# Patient Record
Sex: Male | Born: 1935 | Race: White | Hispanic: No | Marital: Married | Smoking: Never smoker
Health system: Southern US, Community
[De-identification: ages and names within clinical notes are randomized; demographics above are authoritative.]

## PROBLEM LIST (undated history)

## (undated) DIAGNOSIS — K219 Gastro-esophageal reflux disease without esophagitis: Secondary | ICD-10-CM

## (undated) DIAGNOSIS — I10 Essential (primary) hypertension: Secondary | ICD-10-CM

## (undated) DIAGNOSIS — E079 Disorder of thyroid, unspecified: Secondary | ICD-10-CM

## (undated) DIAGNOSIS — R600 Localized edema: Secondary | ICD-10-CM

## (undated) DIAGNOSIS — R7303 Prediabetes: Secondary | ICD-10-CM

## (undated) HISTORY — PX: CARPAL TUNNEL RELEASE: SHX101

## (undated) HISTORY — PX: BACK SURGERY: SHX140

## (undated) HISTORY — PX: CATARACT EXTRACTION: SUR2

## (undated) HISTORY — PX: OTHER SURGICAL HISTORY: SHX169

---

## 2009-11-08 ENCOUNTER — Ambulatory Visit: Payer: Self-pay | Admitting: Unknown Physician Specialty

## 2009-11-09 LAB — PATHOLOGY REPORT

## 2011-01-03 ENCOUNTER — Emergency Department: Payer: Self-pay | Admitting: Emergency Medicine

## 2011-01-29 ENCOUNTER — Ambulatory Visit: Payer: Self-pay | Admitting: Ophthalmology

## 2011-12-31 ENCOUNTER — Ambulatory Visit: Payer: Self-pay

## 2012-01-02 ENCOUNTER — Ambulatory Visit: Payer: Self-pay | Admitting: Orthopedic Surgery

## 2012-01-02 DIAGNOSIS — I10 Essential (primary) hypertension: Secondary | ICD-10-CM

## 2012-01-03 ENCOUNTER — Ambulatory Visit: Payer: Self-pay | Admitting: Orthopedic Surgery

## 2012-01-08 LAB — PATHOLOGY REPORT

## 2013-07-28 ENCOUNTER — Ambulatory Visit: Payer: Self-pay | Admitting: Ophthalmology

## 2013-08-09 ENCOUNTER — Ambulatory Visit: Payer: Self-pay | Admitting: Ophthalmology

## 2014-07-12 NOTE — Op Note (Signed)
PATIENT NAME:  Lucas Melendez, Lucas Melendez MR#:  [id number] DATE OF BIRTH:  [birth year]  DATE OF PROCEDURE:  01/03/2012  PREOPERATIVE DIAGNOSIS: L2 compression fracture.   POSTOPERATIVE DIAGNOSIS: L2 compression fracture.   PROCEDURE: L2 vertebral body biopsy and kyphoplasty.   ANESTHESIA: MAC.   SURGEON: Leitha SchullerMichael J. Keishana Klinger, MD  DESCRIPTION OF PROCEDURE: Patient brought to the Operating Room. After adequate anesthesia was given the back was prepped and draped in the usual sterile fashion after making sure that good imaging was available on both AP and lateral C-arm views. After timeout procedure were carried out 10 mL of 1% Xylocaine with epinephrine was infiltrated into subcutaneous tissue. The spinal needle was then used to localize and give local anesthetic down to the pedicle. Stab incisions were then carried out. First on the right side the trocar was coming down lateral and then through the pedicle into the vertebral body, care being taken on both AP and lateral projections that there is no penetration of the canal or neuroforamen. Next, the biopsy was carried out with good biopsy of the vertebral body obtained with good bone specimen. Melendez drill was then used to get deeper and the trocar was then entered on the left side in Melendez similar fashion but without Melendez biopsy. The two Kyphon inflatable balloons were inserted at the appropriate level and inflated. This gave some reduction to Melendez very flat vertebral body. After that had been completed the left side was left inflated and the right side was filled with 3 mL of polymethylmethacrylate bone cement. The left side was then deflated, shaft of the balloon pulled out and 3 mL placed on the left side. There appeared to be good fill of the vertebral body along the endplates without any extravasation or pushing back towards the canal. There did appear to be some reduction of the large posterior bulge to the vertebral body. The trocars were removed. Permanent C-arm views were  saved. The wounds were covered with Dermabond and then band aids. Patient was sent to recovery room in stable condition.   ESTIMATED BLOOD LOSS: 25 mL.   COMPLICATIONS: None.   SPECIMENS: None.   ____________________________ Leitha SchullerMichael J. Bryant Lipps, MD mjm:cms D: 01/03/2012 16:34:10 ET T: 01/03/2012 16:56:53 ET JOB#: [id number]  cc: Leitha SchullerMichael J. Libbie Bartley, MD, <Dictator>

## 2014-07-16 NOTE — Op Note (Signed)
PATIENT NAME:  Lucas Melendez, Mizael A MR#:  [id number] DATE OF BIRTH:  1931/03/01  DATE OF PROCEDURE:  08/09/2013  PREOPERATIVE DIAGNOSIS: Cataract, right eye.   POSTOPERATIVE DIAGNOSIS: Cataract, right eye.   PROCEDURE PERFORMED: Extracapsular cataract extraction using phacoemulsification with placement of Alcon SN6CWS 18.5-diopter posterior chamber lens, serial number [id number].   SURGEON: Maylon PeppersSteven A. Cadell Gabrielson, MD   ANESTHESIA: 4% lidocaine and 0.75% Marcaine in a 50-50 mixture with 10 units/mL of Hylenex added, given as a peribulbar.   ANESTHESIOLOGIST: Amy M. Rice, MD  COMPLICATIONS: None.   ESTIMATED BLOOD LOSS: Less than 1 mL.   DESCRIPTION OF PROCEDURE:  The patient was brought to the operating room and given a peribulbar block.  The patient was then prepped and draped in the usual fashion.  The vertical rectus muscles were imbricated using 5-0 silk sutures.  These sutures were then clamped to the sterile drapes as bridle sutures.  A limbal peritomy was performed extending two clock hours and hemostasis was obtained with cautery.  A partial thickness scleral groove was made at the surgical limbus and dissected anteriorly in a lamellar dissection using an Alcon crescent knife.  The anterior chamber was entered superonasally with a Superblade and through the lamellar dissection with a 2.6 mm keratome.  DisCoVisc was used to replace the aqueous and a continuous tear capsulorrhexis was carried out.  Hydrodissection and hydrodelineation were carried out with balanced salt and a 27 gauge canula.  The nucleus was rotated to confirm the effectiveness of the hydrodissection.  Phacoemulsification was carried out using a divide-and-conquer technique.  Total ultrasound time was 1 minute and 29 seconds with an average power of 25.5%, CDE of 38.37.  Irrigation/aspiration was used to remove the residual cortex.  DisCoVisc was used to inflate the capsule and the internal incision was enlarged to 3 mm with  the crescent knife.  The intraocular lens was folded and inserted into the capsular bag using the Alcon AcrySert delivery system.  Irrigation/aspiration was used to remove the residual DisCoVisc.  Miostat was injected into the anterior chamber through the paracentesis track to inflate the anterior chamber and induce miosis.  0.1 mL of cefuroxime was injected via the paracentesis track containing 1 mg of drug.  The wound was checked for leaks and none were found. The conjunctiva was closed with cautery and the bridle sutures were removed.  Two drops of 0.3% Vigamox were placed on the eye.   An eye shield was placed on the eye.  The patient was discharged to the recovery room in good condition.   ____________________________ Maylon PeppersSteven A. Berle Fitz, MD sad:jcm D: 08/09/2013 13:39:12 ET T: 08/09/2013 14:39:03 ET JOB#: [id number]  cc: Viviann SpareSteven A. Ilo Beamon, MD, <Dictator> Erline LevineSTEVEN A Alastair Hennes MD ELECTRONICALLY SIGNED 08/30/2013 13:50

## 2015-01-23 ENCOUNTER — Other Ambulatory Visit: Payer: Self-pay | Admitting: Infectious Diseases

## 2015-01-23 DIAGNOSIS — R05 Cough: Secondary | ICD-10-CM

## 2015-01-23 DIAGNOSIS — R053 Chronic cough: Secondary | ICD-10-CM

## 2015-01-23 DIAGNOSIS — R9389 Abnormal findings on diagnostic imaging of other specified body structures: Secondary | ICD-10-CM

## 2015-02-02 ENCOUNTER — Ambulatory Visit
Admission: RE | Admit: 2015-02-02 | Discharge: 2015-02-02 | Disposition: A | Discharge status: Home / Self Care | Payer: Medicare Other | Source: Ambulatory Visit | Attending: Infectious Diseases | Admitting: Infectious Diseases

## 2015-02-02 DIAGNOSIS — R05 Cough: Secondary | ICD-10-CM

## 2015-02-02 DIAGNOSIS — J849 Interstitial pulmonary disease, unspecified: Secondary | ICD-10-CM | POA: Insufficient documentation

## 2015-02-02 DIAGNOSIS — R938 Abnormal findings on diagnostic imaging of other specified body structures: Secondary | ICD-10-CM | POA: Diagnosis present

## 2015-02-02 DIAGNOSIS — R053 Chronic cough: Secondary | ICD-10-CM

## 2015-02-02 DIAGNOSIS — R9389 Abnormal findings on diagnostic imaging of other specified body structures: Secondary | ICD-10-CM

## 2016-03-08 ENCOUNTER — Encounter: Payer: Self-pay | Admitting: Intensive Care

## 2016-03-08 ENCOUNTER — Emergency Department: Payer: Medicare Other

## 2016-03-08 ENCOUNTER — Inpatient Hospital Stay
Admission: EM | Admit: 2016-03-08 | Discharge: 2016-03-12 | DRG: 481 | Disposition: A | Discharge status: Home Health | Payer: Medicare Other | Attending: Internal Medicine | Admitting: Internal Medicine

## 2016-03-08 DIAGNOSIS — K219 Gastro-esophageal reflux disease without esophagitis: Secondary | ICD-10-CM | POA: Diagnosis present

## 2016-03-08 DIAGNOSIS — I10 Essential (primary) hypertension: Secondary | ICD-10-CM | POA: Diagnosis present

## 2016-03-08 DIAGNOSIS — Y92007 Garden or yard of unspecified non-institutional (private) residence as the place of occurrence of the external cause: Secondary | ICD-10-CM | POA: Diagnosis not present

## 2016-03-08 DIAGNOSIS — Z833 Family history of diabetes mellitus: Secondary | ICD-10-CM

## 2016-03-08 DIAGNOSIS — E871 Hypo-osmolality and hyponatremia: Secondary | ICD-10-CM | POA: Diagnosis present

## 2016-03-08 DIAGNOSIS — Z79899 Other long term (current) drug therapy: Secondary | ICD-10-CM

## 2016-03-08 DIAGNOSIS — W19XXXA Unspecified fall, initial encounter: Secondary | ICD-10-CM

## 2016-03-08 DIAGNOSIS — S72141A Displaced intertrochanteric fracture of right femur, initial encounter for closed fracture: Principal | ICD-10-CM | POA: Diagnosis present

## 2016-03-08 DIAGNOSIS — Z808 Family history of malignant neoplasm of other organs or systems: Secondary | ICD-10-CM | POA: Diagnosis not present

## 2016-03-08 DIAGNOSIS — S72001A Fracture of unspecified part of neck of right femur, initial encounter for closed fracture: Secondary | ICD-10-CM

## 2016-03-08 DIAGNOSIS — W1830XA Fall on same level, unspecified, initial encounter: Secondary | ICD-10-CM | POA: Diagnosis present

## 2016-03-08 DIAGNOSIS — M6281 Muscle weakness (generalized): Secondary | ICD-10-CM

## 2016-03-08 DIAGNOSIS — E039 Hypothyroidism, unspecified: Secondary | ICD-10-CM | POA: Diagnosis present

## 2016-03-08 DIAGNOSIS — Z7982 Long term (current) use of aspirin: Secondary | ICD-10-CM | POA: Diagnosis not present

## 2016-03-08 DIAGNOSIS — M25551 Pain in right hip: Secondary | ICD-10-CM

## 2016-03-08 DIAGNOSIS — Z8249 Family history of ischemic heart disease and other diseases of the circulatory system: Secondary | ICD-10-CM

## 2016-03-08 DIAGNOSIS — R262 Difficulty in walking, not elsewhere classified: Secondary | ICD-10-CM

## 2016-03-08 DIAGNOSIS — E785 Hyperlipidemia, unspecified: Secondary | ICD-10-CM | POA: Diagnosis present

## 2016-03-08 DIAGNOSIS — Z419 Encounter for procedure for purposes other than remedying health state, unspecified: Secondary | ICD-10-CM

## 2016-03-08 HISTORY — DX: Essential (primary) hypertension: I10

## 2016-03-08 HISTORY — DX: Disorder of thyroid, unspecified: E07.9

## 2016-03-08 HISTORY — DX: Gastro-esophageal reflux disease without esophagitis: K21.9

## 2016-03-08 LAB — CBC WITH DIFFERENTIAL/PLATELET
BASOS PCT: 1 %
Basophils Absolute: 0 10*3/uL (ref 0–0.1)
EOS ABS: 0.1 10*3/uL (ref 0–0.7)
EOS PCT: 1 %
HCT: 41.6 % (ref 40.0–52.0)
Hemoglobin: 14.4 g/dL (ref 13.0–18.0)
LYMPHS ABS: 1.3 10*3/uL (ref 1.0–3.6)
Lymphocytes Relative: 13 %
MCH: 33.2 pg (ref 26.0–34.0)
MCHC: 34.6 g/dL (ref 32.0–36.0)
MCV: 95.8 fL (ref 80.0–100.0)
MONOS PCT: 7 %
Monocytes Absolute: 0.7 10*3/uL (ref 0.2–1.0)
Neutro Abs: 7.9 10*3/uL — ABNORMAL HIGH (ref 1.4–6.5)
Neutrophils Relative %: 78 %
PLATELETS: 290 10*3/uL (ref 150–440)
RBC: 4.34 MIL/uL — ABNORMAL LOW (ref 4.40–5.90)
RDW: 12.4 % (ref 11.5–14.5)
WBC: 10 10*3/uL (ref 3.8–10.6)

## 2016-03-08 LAB — TSH: TSH: 1.225 u[IU]/mL (ref 0.350–4.500)

## 2016-03-08 LAB — COMPREHENSIVE METABOLIC PANEL
ALK PHOS: 70 U/L (ref 38–126)
ALT: 15 U/L — ABNORMAL LOW (ref 17–63)
ANION GAP: 8 (ref 5–15)
AST: 20 U/L (ref 15–41)
Albumin: 4.5 g/dL (ref 3.5–5.0)
BILIRUBIN TOTAL: 0.7 mg/dL (ref 0.3–1.2)
BUN: 16 mg/dL (ref 6–20)
CALCIUM: 8.9 mg/dL (ref 8.9–10.3)
CO2: 26 mmol/L (ref 22–32)
Chloride: 94 mmol/L — ABNORMAL LOW (ref 101–111)
Creatinine, Ser: 0.87 mg/dL (ref 0.61–1.24)
GLUCOSE: 141 mg/dL — AB (ref 65–99)
Potassium: 4.4 mmol/L (ref 3.5–5.1)
Sodium: 128 mmol/L — ABNORMAL LOW (ref 135–145)
TOTAL PROTEIN: 7.7 g/dL (ref 6.5–8.1)

## 2016-03-08 LAB — TYPE AND SCREEN
ABO/RH(D): A POS
Antibody Screen: NEGATIVE

## 2016-03-08 MED ORDER — FENTANYL CITRATE (PF) 100 MCG/2ML IJ SOLN
100.0000 ug | Freq: Once | INTRAMUSCULAR | Status: AC
  Administered 2016-03-08: 100 ug via INTRAVENOUS

## 2016-03-08 MED ORDER — LISINOPRIL 20 MG PO TABS
40.0000 mg | ORAL_TABLET | Freq: Every day | ORAL | Status: DC
  Administered 2016-03-10 – 2016-03-12 (×3): 40 mg via ORAL
  Filled 2016-03-08 (×3): qty 2

## 2016-03-08 MED ORDER — ACETAMINOPHEN 325 MG PO TABS
650.0000 mg | ORAL_TABLET | Freq: Four times a day (QID) | ORAL | Status: DC | PRN
  Administered 2016-03-11: 650 mg via ORAL
  Filled 2016-03-08: qty 2

## 2016-03-08 MED ORDER — OXYCODONE HCL 5 MG PO TABS
ORAL_TABLET | ORAL | Status: AC
  Filled 2016-03-08: qty 1

## 2016-03-08 MED ORDER — OXYCODONE HCL 5 MG PO TABS
ORAL_TABLET | ORAL | Status: AC
  Administered 2016-03-08: 5 mg via ORAL
  Filled 2016-03-08: qty 1

## 2016-03-08 MED ORDER — MORPHINE SULFATE (PF) 4 MG/ML IV SOLN
2.0000 mg | INTRAVENOUS | Status: DC | PRN
  Administered 2016-03-08 – 2016-03-09 (×3): 2 mg via INTRAVENOUS
  Filled 2016-03-08 (×3): qty 1

## 2016-03-08 MED ORDER — SODIUM CHLORIDE 0.9 % IV SOLN
INTRAVENOUS | Status: DC
  Administered 2016-03-08 – 2016-03-10 (×3): via INTRAVENOUS

## 2016-03-08 MED ORDER — ACETAMINOPHEN 650 MG RE SUPP: 650.0000 mg | Freq: Four times a day (QID) | RECTAL | Status: DC | PRN

## 2016-03-08 MED ORDER — SIMVASTATIN 20 MG PO TABS
10.0000 mg | ORAL_TABLET | Freq: Every day | ORAL | Status: DC
  Administered 2016-03-10 – 2016-03-11 (×2): 10 mg via ORAL
  Filled 2016-03-08 (×2): qty 1

## 2016-03-08 MED ORDER — OXYCODONE HCL 5 MG PO TABS
5.0000 mg | ORAL_TABLET | ORAL | Status: DC | PRN
  Administered 2016-03-08 – 2016-03-12 (×5): 5 mg via ORAL
  Filled 2016-03-08 (×4): qty 1

## 2016-03-08 MED ORDER — LEVOTHYROXINE SODIUM 75 MCG PO TABS
150.0000 ug | ORAL_TABLET | Freq: Every day | ORAL | Status: DC
  Administered 2016-03-10 – 2016-03-12 (×3): 150 ug via ORAL
  Filled 2016-03-08 (×3): qty 2

## 2016-03-08 MED ORDER — FENTANYL CITRATE (PF) 100 MCG/2ML IJ SOLN
INTRAMUSCULAR | Status: AC
  Administered 2016-03-08: 100 ug via INTRAVENOUS
  Filled 2016-03-08: qty 2

## 2016-03-08 MED ORDER — ONDANSETRON HCL 4 MG/2ML IJ SOLN
4.0000 mg | Freq: Once | INTRAMUSCULAR | Status: AC
  Administered 2016-03-08: 4 mg via INTRAVENOUS

## 2016-03-08 MED ORDER — ONDANSETRON HCL 4 MG/2ML IJ SOLN
INTRAMUSCULAR | Status: AC
  Administered 2016-03-08: 4 mg via INTRAVENOUS
  Filled 2016-03-08: qty 2

## 2016-03-08 NOTE — ED Provider Notes (Signed)
[facility] Emergency Department Provider Note  Time seen: 6:52 PM  I have reviewed the triage vital signs and the nursing notes.   HISTORY  Chief Complaint Hip Pain (Right) and Fall    HPI Lucas Melendez is a 82 y.o. male with a past medical history of hypertension, gastric reflux, presents to the emergency department with right hip pain after a fall. According to the patient he was attempting to reach down into a garbage can when he lost his footing causing him to fall onto his right side. States immediate pain to the right hip, unable to bear weight due to pain. Did not hit head, did not lose consciousness. Patient describes severe right hip pain with any attempted movement, mild right hip pain when resting. Denies any other pains at this time.  Past Medical History:  Diagnosis Date  . Acid reflux   . Hypertension   . Thyroid disease    hyper    There are no active problems to display for this patient.   Past Surgical History:  Procedure Laterality Date  . BACK SURGERY    . CARPAL TUNNEL RELEASE      Prior to Admission medications   Not on File    No Known Allergies  History reviewed. No pertinent family history.  Social History Social History  Substance Use Topics  . Smoking status: Never Smoker  . Smokeless tobacco: Never Used  . Alcohol use 12.6 oz/week    21 Cans of beer per week     Comment: 2-3 beers everynight    Review of Systems Constitutional: Negative for fever. Cardiovascular: Negative for chest pain. Respiratory: Negative for shortness of breath. Gastrointestinal: Negative for abdominal pain Musculoskeletal: Right hip pain Neurological: Negative for headache. Denies any weakness or numbness. 10-point ROS otherwise negative.  ____________________________________________   PHYSICAL EXAM:  VITAL SIGNS: ED Triage Vitals  Enc Vitals Group     BP 03/08/16 1825 (!) 187/103     Pulse Rate 03/08/16 1825 75     Resp  03/08/16 1825 20     Temp 03/08/16 1825 97.9 F (36.6 C)     Temp Source 03/08/16 1825 Oral     SpO2 03/08/16 1825 98 %     Weight 03/08/16 1826 220 lb (99.8 kg)     Height 03/08/16 1826 5\' 10"  (1.778 m)     Head Circumference --      Peak Flow --      Pain Score 03/08/16 1826 7     Pain Loc --      Pain Edu? --      Excl. in GC? --     Constitutional: Alert and oriented. Well appearing and in no distress. Eyes: Normal exam ENT   Head: Normocephalic and atraumatic.   Mouth/Throat: Mucous membranes are moist. Cardiovascular: Normal rate, regular rhythm. No murmur Respiratory: Normal respiratory effort without tachypnea nor retractions. Breath sounds are clear Gastrointestinal: Soft and nontender. No distention. Musculoskeletal: Mild right hip tenderness to palpation, significant pain with attempted range of motion. Neurovascularly intact distally, extremity is otherwise atraumatic. Stable. Neurologic:  Normal speech and language. No gross focal neurologic deficits Skin:  Skin is warm, dry and intact.  Psychiatric: Mood and affect are normal.   ____________________________________________    EKG  EKG shows normal sinus rhythm at 78 bpm, narrow QRS, left axis deviation, nonspecific but no concerning ST changes, largely normal intervals.  ____________________________________________    RADIOLOGY  Right intertrochanteric fracture. Chest  x-ray shows interstitial fibrosis and cardiomegaly.  ____________________________________________   INITIAL IMPRESSION / ASSESSMENT AND PLAN / ED COURSE  Pertinent labs & imaging results that were available during my care of the patient were reviewed by me and considered in my medical decision making (see chart for details).  Patient presents with right hip pain after fall. We'll obtain x-ray imaging, highly suspect fracture. Overall the patient appears well, no distress while resting comfortably in bed.  X-ray consistent with  right intertrochanteric fracture. Labs show a slight hyponatremia. Patient will be admitted to the hospitalist service with orthopedics consultation.  ____________________________________________   FINAL CLINICAL IMPRESSION(S) / ED DIAGNOSES  Fall Right intertrochanteric fracture   Minna AntisKevin Griselda Bramblett, MD 03/08/16 [phone]

## 2016-03-08 NOTE — H&P (Signed)
[facility] [place] [facility]   PATIENT NAME: Lucas BatemanDelano Mapes    MR#:  [id number]  DATE OF BIRTH:  07-Feb-1931  DATE OF ADMISSION:  03/08/2016  PRIMARY CARE PHYSICIAN: Gayla DossFitzgerald, David M, MD   REQUESTING/REFERRING PHYSICIAN: Dr Minna AntisKevin Paduchowski  CHIEF COMPLAINT:   Chief Complaint  Patient presents with  . Hip Pain    Right  . Fall    HISTORY OF PRESENT ILLNESS:  Lucas BatemanDelano Lucas Melendez  is a 82 y.o. male presents with right hip pain. He threw stuff in the garbage can that he thought his wife may have wanted and he was reaching into the garbage and he got tangled up and fell on his hip and couldn't get up. The neighbors helped him to get into the house and he couldn't straighten out his leg. Having a good amount of pain in his right hip and came into the hospital for further evaluation. He was found to have a right hip fracture. No complaints of chest pain or shortness of breath. Good exercise capacity when he walks around.  PAST MEDICAL HISTORY:   Past Medical History:  Diagnosis Date  . Acid reflux   . Hypertension   . Thyroid disease    hyper    PAST SURGICAL HISTORY:   Past Surgical History:  Procedure Laterality Date  . BACK SURGERY    . CARPAL TUNNEL RELEASE    . CATARACT EXTRACTION    . khyphoplasty      SOCIAL HISTORY:   Social History  Substance Use Topics  . Smoking status: Never Smoker  . Smokeless tobacco: Never Used  . Alcohol use 12.6 oz/week    21 Cans of beer per week     Comment: 3 beers everynight    FAMILY HISTORY:   Family History  Problem Relation Age of Onset  . CAD Mother   . Diabetes Mother   . Throat cancer Father     DRUG ALLERGIES:  No Known Allergies  REVIEW OF SYSTEMS:  CONSTITUTIONAL: No fever, fatigue or weakness.  EYES: 1 I week her vision. EARS, NOSE, AND THROAT: No tinnitus or ear pain. No sore throat RESPIRATORY: No cough, shortness of breath, wheezing or hemoptysis.  CARDIOVASCULAR: No chest  pain, orthopnea, edema.  GASTROINTESTINAL: No nausea, vomiting, diarrhea or abdominal pain. No blood in bowel movements GENITOURINARY: No dysuria, hematuria.  ENDOCRINE: No polyuria, nocturia. Positive for hypothyroidism  HEMATOLOGY: No anemia, easy bruising or bleeding SKIN: No rash or lesion. MUSCULOSKELETAL: Right hip pain. Arthritis bilateral hips  NEUROLOGIC: No tingling, numbness, weakness.  PSYCHIATRY: No anxiety or depression.   MEDICATIONS AT HOME:   Prior to Admission medications   Medication Sig Start Date End Date Taking? Authorizing Provider  aspirin EC 81 MG tablet Take 81 mg by mouth daily.   Yes Historical Provider, MD  levothyroxine (SYNTHROID, LEVOTHROID) 150 MCG tablet Take 150 mcg by mouth daily before breakfast.   Yes Historical Provider, MD  lisinopril (PRINIVIL,ZESTRIL) 40 MG tablet Take 40 mg by mouth daily.   Yes Historical Provider, MD  simvastatin (ZOCOR) 10 MG tablet Take 1 tablet by mouth daily. 01/01/16   Historical Provider, MD      VITAL SIGNS:  Blood pressure (!) 145/72, pulse 87, temperature 97.9 F (36.6 C), temperature source Oral, resp. rate 20, height 5\' 10"  (1.778 m), weight 99.8 kg (220 lb), SpO2 100 %.  PHYSICAL EXAMINATION:  GENERAL:  82 y.o.-year-old patient lying in the bed with no acute distress.  EYES:  Pupils equal, round, reactive to light and accommodation. No scleral icterus. Extraocular muscles intact.  HEENT: Head atraumatic, normocephalic. Oropharynx and nasopharynx clear.  NECK:  Supple, no jugular venous distention. No thyroid enlargement, no tenderness.  LUNGS: Normal breath sounds bilaterally, no wheezing, rales,rhonchi or crepitation. No use of accessory muscles of respiration.  CARDIOVASCULAR: S1, S2 normal. No murmurs, rubs, or gallops.  ABDOMEN: Soft, nontender, nondistended. Bowel sounds present. No organomegaly or mass.  EXTREMITIES: Trace edema. No cyanosis, or clubbing.  Right leg shortened and externally  rotated NEUROLOGIC: Cranial nerves II through XII are intact. Sensation intact bilateral lower extremities. Gait not checked. Able to flex and extend bilateral ankles PSYCHIATRIC: The patient is alert and oriented x 3.  SKIN: No rash, lesion, or ulcer.   LABORATORY PANEL:   CBC  Recent Labs Lab 03/08/16 1837  WBC 10.0  HGB 14.4  HCT 41.6  PLT 290   ------------------------------------------------------------------------------------------------------------------  Chemistries   Recent Labs Lab 03/08/16 1837  NA 128*  K 4.4  CL 94*  CO2 26  GLUCOSE 141*  BUN 16  CREATININE 0.87  CALCIUM 8.9  AST 20  ALT 15*  ALKPHOS 70  BILITOT 0.7   ------------------------------------------------------------------------------------------------------------------    RADIOLOGY:  Dg Chest 1 View  Result Date: 03/08/2016 CLINICAL DATA:  Right hip fracture following a fall today. EXAM: CHEST 1 VIEW COMPARISON:  Chest CT dated 02/02/2015. FINDINGS: Enlarged cardiac silhouette. Diffusely prominent interstitial markings. Thoracic spine degenerative changes. IMPRESSION: Cardiomegaly and interstitial fibrosis. Electronically Signed   By: Beckie SaltsSteven  Reid M.D.   On: 03/08/2016 19:29   Dg Hip Unilat W Or Wo Pelvis 2-3 Views Right  Result Date: 03/08/2016 CLINICAL DATA:  Right hip pain after falling today. EXAM: DG HIP (WITH OR WITHOUT PELVIS) 2-3V RIGHT COMPARISON:  None. FINDINGS: Comminuted right intertrochanteric fracture with distraction of the fragments superiorly. Diffuse osteopenia. IMPRESSION: Comminuted right intertrochanteric fracture. Electronically Signed   By: Beckie SaltsSteven  Reid M.D.   On: 03/08/2016 19:28    EKG:   Normal sinus rhythm 78 bpm, APC, left axis deviation  IMPRESSION AND PLAN:   1. Preoperative consultation for right hip fracture, closed, initial encounter requiring operative repair. No contraindications to surgery at this time. ER physician spoke with on-call orthopedic  surgeon to evaluate the patient for surgery tomorrow. 2. Hyponatremia. Looking back at old labs the patient has had some low sodiums in the past. This could be secondary to drinking 3 beers per day versus interstitial lung disease. The patient also has hypothyroidism and elevated on a TSH to prior labs. Gentle fluid hydration overnight and recheck a sodium in the morning. 3. Essential hypertension on lisinopril 4. Hyperlipidemia unspecified on Zocor 5. Hypothyroidism unspecified on levothyroxine. Check a TSH 6. History of interstitial lung disease on chest x-ray and prior CT scan. Patient never has any shortness of breath. Patient has a pulse ox of 100% on room air.   All the records are reviewed and case discussed with ED provider. Management plans discussed with the patient, family and they are in agreement.  CODE STATUS: Full code  TOTAL TIME TAKING CARE OF THIS PATIENT: 50 minutes.    Alford HighlandWIETING, Hollynn Garno M.D on 03/08/2016 at 8:25 PM  Between 7am to 6pm - Pager - [phone]  After 6pm call admission pager [phone]  [facility]  [phone]  CC: Primary care physician; Gayla DossFitzgerald, David M, MD

## 2016-03-08 NOTE — ED Triage Notes (Signed)
Patient arrived by EMS from home for fall. PAtient c/o R hip pain. Patient states he has been having groin pain off and on for months that occurs more with yard work. Patients feet slid out from under him when taking out the trash. HX HTN and Hyperthyroid. Denies hitting head or LOC. Takes 81mg  aspirin daily. CLear lungs. 200/80 b/p and 98% RA with EMS. A&O x4 upon arrival

## 2016-03-09 ENCOUNTER — Inpatient Hospital Stay: Payer: Medicare Other | Admitting: Anesthesiology

## 2016-03-09 ENCOUNTER — Inpatient Hospital Stay: Payer: Medicare Other

## 2016-03-09 ENCOUNTER — Encounter
Admission: EM | Disposition: A | Discharge status: Home Health | Payer: Self-pay | Source: Home / Self Care | Attending: Internal Medicine

## 2016-03-09 ENCOUNTER — Encounter: Payer: Self-pay | Admitting: Anesthesiology

## 2016-03-09 HISTORY — PX: COMPRESSION HIP SCREW: SHX1386

## 2016-03-09 LAB — CBC
HCT: 36.1 % — ABNORMAL LOW (ref 40.0–52.0)
Hemoglobin: 12.8 g/dL — ABNORMAL LOW (ref 13.0–18.0)
MCH: 33.4 pg (ref 26.0–34.0)
MCHC: 35.4 g/dL (ref 32.0–36.0)
MCV: 94.4 fL (ref 80.0–100.0)
PLATELETS: 245 10*3/uL (ref 150–440)
RBC: 3.83 MIL/uL — ABNORMAL LOW (ref 4.40–5.90)
RDW: 12.9 % (ref 11.5–14.5)
WBC: 9.5 10*3/uL (ref 3.8–10.6)

## 2016-03-09 LAB — SODIUM, URINE, RANDOM: Sodium, Ur: 150 mmol/L

## 2016-03-09 LAB — BASIC METABOLIC PANEL
Anion gap: 6 (ref 5–15)
BUN: 13 mg/dL (ref 6–20)
CALCIUM: 8.4 mg/dL — AB (ref 8.9–10.3)
CO2: 24 mmol/L (ref 22–32)
CREATININE: 0.7 mg/dL (ref 0.61–1.24)
Chloride: 100 mmol/L — ABNORMAL LOW (ref 101–111)
GFR calc non Af Amer: >60 mL/min (ref >60)
GLUCOSE: 137 mg/dL — AB (ref 65–99)
Potassium: 4.2 mmol/L (ref 3.5–5.1)
Sodium: 130 mmol/L — ABNORMAL LOW (ref 135–145)

## 2016-03-09 LAB — MRSA PCR SCREENING: MRSA by PCR: NEGATIVE

## 2016-03-09 LAB — OSMOLALITY, URINE: OSMOLALITY UR: 743 mosm/kg (ref 300–900)

## 2016-03-09 SURGERY — COMPRESSION HIP
Anesthesia: General | Site: Hip | Laterality: Right | Wound class: Clean

## 2016-03-09 MED ORDER — NEOMYCIN-POLYMYXIN B GU 40-200000 IR SOLN
Status: DC | PRN
  Administered 2016-03-09: 4 mL

## 2016-03-09 MED ORDER — FENTANYL CITRATE (PF) 100 MCG/2ML IJ SOLN
INTRAMUSCULAR | Status: DC | PRN
  Administered 2016-03-09 (×2): 50 ug via INTRAVENOUS

## 2016-03-09 MED ORDER — FENTANYL CITRATE (PF) 100 MCG/2ML IJ SOLN
25.0000 ug | INTRAMUSCULAR | Status: DC | PRN
  Administered 2016-03-09 (×2): 25 ug via INTRAVENOUS

## 2016-03-09 MED ORDER — PROPOFOL 10 MG/ML IV BOLUS
INTRAVENOUS | Status: DC | PRN
  Administered 2016-03-09: 120 mg via INTRAVENOUS

## 2016-03-09 MED ORDER — ENOXAPARIN SODIUM 40 MG/0.4ML ~~LOC~~ SOLN
40.0000 mg | SUBCUTANEOUS | Status: DC
  Administered 2016-03-10 – 2016-03-12 (×3): 40 mg via SUBCUTANEOUS
  Filled 2016-03-09 (×3): qty 0.4

## 2016-03-09 MED ORDER — ONDANSETRON HCL 4 MG PO TABS
4.0000 mg | ORAL_TABLET | Freq: Four times a day (QID) | ORAL | Status: DC | PRN
  Administered 2016-03-11: 4 mg via ORAL
  Filled 2016-03-09: qty 1

## 2016-03-09 MED ORDER — FENTANYL CITRATE (PF) 100 MCG/2ML IJ SOLN
INTRAMUSCULAR | Status: AC
  Filled 2016-03-09: qty 2

## 2016-03-09 MED ORDER — METOCLOPRAMIDE HCL 10 MG PO TABS: 5.0000 mg | ORAL_TABLET | Freq: Three times a day (TID) | ORAL | Status: DC | PRN

## 2016-03-09 MED ORDER — DEXAMETHASONE SODIUM PHOSPHATE 10 MG/ML IJ SOLN
INTRAMUSCULAR | Status: DC | PRN
  Administered 2016-03-09: 4 mg via INTRAVENOUS

## 2016-03-09 MED ORDER — ONDANSETRON HCL 4 MG/2ML IJ SOLN
INTRAMUSCULAR | Status: DC | PRN
  Administered 2016-03-09: 4 mg via INTRAVENOUS

## 2016-03-09 MED ORDER — SUCCINYLCHOLINE CHLORIDE 20 MG/ML IJ SOLN
INTRAMUSCULAR | Status: DC | PRN
  Administered 2016-03-09: 120 mg via INTRAVENOUS

## 2016-03-09 MED ORDER — PHENYLEPHRINE HCL 10 MG/ML IJ SOLN
INTRAMUSCULAR | Status: DC | PRN
  Administered 2016-03-09: 100 ug via INTRAVENOUS

## 2016-03-09 MED ORDER — OXYCODONE HCL 5 MG/5ML PO SOLN: 5.0000 mg | Freq: Once | ORAL | Status: DC | PRN

## 2016-03-09 MED ORDER — LIDOCAINE HCL (CARDIAC) 20 MG/ML IV SOLN
INTRAVENOUS | Status: DC | PRN
  Administered 2016-03-09: 60 mg via INTRAVENOUS

## 2016-03-09 MED ORDER — METOCLOPRAMIDE HCL 5 MG/ML IJ SOLN: 5.0000 mg | Freq: Three times a day (TID) | INTRAMUSCULAR | Status: DC | PRN

## 2016-03-09 MED ORDER — ROCURONIUM BROMIDE 100 MG/10ML IV SOLN
INTRAVENOUS | Status: DC | PRN
  Administered 2016-03-09: 5 mg via INTRAVENOUS
  Administered 2016-03-09: 10 mg via INTRAVENOUS

## 2016-03-09 MED ORDER — SUGAMMADEX SODIUM 500 MG/5ML IV SOLN
INTRAVENOUS | Status: DC | PRN
Start: 2016-03-09 — End: 2016-03-09
  Administered 2016-03-09: 200 mg via INTRAVENOUS

## 2016-03-09 MED ORDER — KETOROLAC TROMETHAMINE 15 MG/ML IJ SOLN
15.0000 mg | Freq: Three times a day (TID) | INTRAMUSCULAR | Status: AC
  Administered 2016-03-09 – 2016-03-10 (×3): 15 mg via INTRAVENOUS
  Filled 2016-03-09 (×3): qty 1

## 2016-03-09 MED ORDER — CEFAZOLIN SODIUM-DEXTROSE 2-4 GM/100ML-% IV SOLN
2.0000 g | Freq: Three times a day (TID) | INTRAVENOUS | Status: AC
  Administered 2016-03-09 – 2016-03-10 (×3): 2 g via INTRAVENOUS
  Filled 2016-03-09 (×3): qty 100

## 2016-03-09 MED ORDER — ONDANSETRON HCL 4 MG/2ML IJ SOLN: 4.0000 mg | Freq: Four times a day (QID) | INTRAMUSCULAR | Status: DC | PRN

## 2016-03-09 MED ORDER — ACETAMINOPHEN 10 MG/ML IV SOLN
INTRAVENOUS | Status: DC | PRN
  Administered 2016-03-09: 1000 mg via INTRAVENOUS

## 2016-03-09 MED ORDER — OXYCODONE HCL 5 MG PO TABS: 5.0000 mg | ORAL_TABLET | Freq: Once | ORAL | Status: DC | PRN

## 2016-03-09 SURGICAL SUPPLY — 32 items
BLADE HELICAL 105 (Orthopedic Implant) ×2 IMPLANT
BLADE HELICAL 105MM (Orthopedic Implant) ×1 IMPLANT
CANISTER SUCT 1200ML W/VALVE (MISCELLANEOUS) ×3 IMPLANT
DRAPE C-ARMOR (DRAPES) ×3 IMPLANT
DRAPE SHEET LG 3/4 BI-LAMINATE (DRAPES) ×3 IMPLANT
DRAPE TABLE BACK 80X90 (DRAPES) ×3 IMPLANT
DRSG DERMACEA 8X12 NADH (GAUZE/BANDAGES/DRESSINGS) ×3 IMPLANT
DURAPREP 26ML APPLICATOR (WOUND CARE) ×3 IMPLANT
ELECT REM PT RETURN 9FT ADLT (ELECTROSURGICAL) ×3
ELECTRODE REM PT RTRN 9FT ADLT (ELECTROSURGICAL) ×1 IMPLANT
GAUZE SPONGE 4X4 12PLY STRL (GAUZE/BANDAGES/DRESSINGS) ×3 IMPLANT
GLOVE BIOGEL M STRL SZ7.5 (GLOVE) ×3 IMPLANT
GLOVE INDICATOR 8.0 STRL GRN (GLOVE) ×3 IMPLANT
GOWN STRL REUS W/ TWL LRG LVL3 (GOWN DISPOSABLE) ×2 IMPLANT
GOWN STRL REUS W/TWL LRG LVL3 (GOWN DISPOSABLE) ×4
GUIDEWIRE 3.2X400 (WIRE) ×3 IMPLANT
MAT BLUE FLOOR 46X72 FLO (MISCELLANEOUS) ×3 IMPLANT
NAIL TFN 11/125 170 (Nail) ×3 IMPLANT
NS IRRIG 1000ML POUR BTL (IV SOLUTION) ×3 IMPLANT
PACK HIP COMPR (MISCELLANEOUS) ×3 IMPLANT
REAMER ROD DEEP FLUTE 2.5X950 (INSTRUMENTS) ×3 IMPLANT
SCREW LOCKING IM 5.0MX40M (Screw) ×3 IMPLANT
SOL PREP PVP 2OZ (MISCELLANEOUS) ×3
SOLUTION PREP PVP 2OZ (MISCELLANEOUS) ×1 IMPLANT
STAPLER SKIN PROX 35W (STAPLE) ×3 IMPLANT
STRAP SAFETY BODY (MISCELLANEOUS) ×3 IMPLANT
SUCTION FRAZIER HANDLE 10FR (MISCELLANEOUS) ×2
SUCTION TUBE FRAZIER 10FR DISP (MISCELLANEOUS) ×1 IMPLANT
SUT VIC AB 0 CT1 36 (SUTURE) ×3 IMPLANT
SUT VIC AB 1 CT1 36 (SUTURE) ×3 IMPLANT
SUT VIC AB 2-0 CT1 27 (SUTURE) ×2
SUT VIC AB 2-0 CT1 TAPERPNT 27 (SUTURE) ×1 IMPLANT

## 2016-03-09 NOTE — Anesthesia Preprocedure Evaluation (Signed)
Anesthesia Evaluation  Patient identified by MRN, date of birth, ID band Patient awake    Reviewed: Allergy & Precautions, H&P , NPO status , Patient's Chart, lab work & pertinent test results  History of Anesthesia Complications Negative for: history of anesthetic complications  Airway Mallampati: III  TM Distance: <3 FB Neck ROM: limited    Dental no notable dental hx. (+) Poor Dentition, Chipped, Missing, Partial Lower   Pulmonary neg pulmonary ROS, neg shortness of breath,    Pulmonary exam normal breath sounds clear to auscultation       Cardiovascular Exercise Tolerance: Good hypertension, (-) angina(-) Past MI and (-) DOE Normal cardiovascular exam Rhythm:regular Rate:Normal     Neuro/Psych negative neurological ROS  negative psych ROS   GI/Hepatic Neg liver ROS, GERD  Controlled,  Endo/Other  negative endocrine ROS  Renal/GU      Musculoskeletal   Abdominal   Peds  Hematology negative hematology ROS (+)   Anesthesia Other Findings Past Medical History: No date: Acid reflux No date: Hypertension No date: Thyroid disease     Comment: hyper  Past Surgical History: No date: BACK SURGERY No date: CARPAL TUNNEL RELEASE No date: CATARACT EXTRACTION No date: khyphoplasty  BMI    Body Mass Index:  31.57 kg/m      Reproductive/Obstetrics negative OB ROS                             Anesthesia Physical Anesthesia Plan  ASA: III  Anesthesia Plan: General ETT   Post-op Pain Management:    Induction:   Airway Management Planned:   Additional Equipment:   Intra-op Plan:   Post-operative Plan:   Informed Consent: I have reviewed the patients History and Physical, chart, labs and discussed the procedure including the risks, benefits and alternatives for the proposed anesthesia with the patient or authorized representative who has indicated his/her understanding and  acceptance.   Dental Advisory Given  Plan Discussed with: Anesthesiologist, CRNA and Surgeon  Anesthesia Plan Comments: (Due to patient previous back surgery plan is for GETA over spinal  Patient and family informed that patient is higher risk for complications from anesthesia during this procedure due to their medical history and age including but not limited to post operative cognitive dysfunction.  They voiced understanding. )        Anesthesia Quick Evaluation

## 2016-03-09 NOTE — Progress Notes (Signed)
[employer] - [place] at [facility]   PATIENT NAME: Lucas Melendez    MR#:  [id number]  DATE OF BIRTH:  1930-12-16  SUBJECTIVE:  CHIEF COMPLAINT:   Chief Complaint  Patient presents with  . Hip Pain    Right  . Fall   No pain in his right hip at this time. Return from surgery. No shortness of breath or fever or vomiting.  REVIEW OF SYSTEMS:    Review of Systems  Constitutional: Negative for chills and fever.  HENT: Negative for sore throat.   Eyes: Negative for blurred vision, double vision and pain.  Respiratory: Negative for cough, hemoptysis, shortness of breath and wheezing.   Cardiovascular: Negative for chest pain, palpitations, orthopnea and leg swelling.  Gastrointestinal: Negative for abdominal pain, constipation, diarrhea, heartburn, nausea and vomiting.  Genitourinary: Negative for dysuria and hematuria.  Musculoskeletal: Positive for joint pain. Negative for back pain.  Skin: Negative for rash.  Neurological: Negative for sensory change, speech change, focal weakness and headaches.  Endo/Heme/Allergies: Does not bruise/bleed easily.  Psychiatric/Behavioral: Negative for depression. The patient is not nervous/anxious.     DRUG ALLERGIES:  No Known Allergies  VITALS:  Blood pressure (!) 126/58, pulse 76, temperature 98.5 F (36.9 C), temperature source Oral, resp. rate 18, height 5\' 10"  (1.778 m), weight 99.8 kg (220 lb), SpO2 94 %.  PHYSICAL EXAMINATION:   Physical Exam  GENERAL:  82 y.o.-year-old patient lying in the bed with no acute distress.  EYES: Pupils equal, round, reactive to light and accommodation. No scleral icterus. Extraocular muscles intact.  HEENT: Head atraumatic, normocephalic. Oropharynx and nasopharynx clear.  NECK:  Supple, no jugular venous distention. No thyroid enlargement, no tenderness.  LUNGS: Normal breath sounds bilaterally, no wheezing, rales, rhonchi. No use of accessory muscles of respiration.  CARDIOVASCULAR:  S1, S2 normal. No murmurs, rubs, or gallops.  ABDOMEN: Soft, nontender, nondistended. Bowel sounds present. No organomegaly or mass.  EXTREMITIES: No cyanosis, clubbing or edema b/l.   Right hip dressing NEUROLOGIC: Cranial nerves II through XII are intact. No focal Motor or sensory deficits b/l.   PSYCHIATRIC: The patient is alert and oriented x 3.  SKIN: No obvious rash, lesion, or ulcer.   LABORATORY PANEL:   CBC  Recent Labs Lab 03/09/16 0350  WBC 9.5  HGB 12.8*  HCT 36.1*  PLT 245   ------------------------------------------------------------------------------------------------------------------ Chemistries   Recent Labs Lab 03/08/16 1837 03/09/16 0350  NA 128* 130*  K 4.4 4.2  CL 94* 100*  CO2 26 24  GLUCOSE 141* 137*  BUN 16 13  CREATININE 0.87 0.70  CALCIUM 8.9 8.4*  AST 20  --   ALT 15*  --   ALKPHOS 70  --   BILITOT 0.7  --    ------------------------------------------------------------------------------------------------------------------  Cardiac Enzymes No results for input(s): TROPONINI in the last 168 hours. ------------------------------------------------------------------------------------------------------------------  RADIOLOGY:  Dg Chest 1 View  Result Date: 03/08/2016 CLINICAL DATA:  Right hip fracture following a fall today. EXAM: CHEST 1 VIEW COMPARISON:  Chest CT dated 02/02/2015. FINDINGS: Enlarged cardiac silhouette. Diffusely prominent interstitial markings. Thoracic spine degenerative changes. IMPRESSION: Cardiomegaly and interstitial fibrosis. Electronically Signed   By: Beckie SaltsSteven  Reid M.D.   On: 03/08/2016 19:29   Dg Hip Operative Unilat W Or W/o Pelvis Right  Result Date: 03/09/2016 CLINICAL DATA:  Internal fixation EXAM: OPERATIVE RIGHT HIP (WITH PELVIS IF PERFORMED) 4 VIEWS TECHNIQUE: Fluoroscopic spot image(s) were submitted for interpretation post-operatively. COMPARISON:  03/08/2016 FINDINGS: Intraoperative spot imaging  demonstrates changes of internal fixation across the right femoral intertrochanteric fracture. Near anatomic alignment. No hardware complicating feature. IMPRESSION: Internal fixation without visible complicating feature. Electronically Signed   By: Charlett NoseKevin  Dover M.D.   On: 03/09/2016 10:14   Dg Hip Unilat W Or Wo Pelvis 2-3 Views Right  Result Date: 03/08/2016 CLINICAL DATA:  Right hip pain after falling today. EXAM: DG HIP (WITH OR WITHOUT PELVIS) 2-3V RIGHT COMPARISON:  None. FINDINGS: Comminuted right intertrochanteric fracture with distraction of the fragments superiorly. Diffuse osteopenia. IMPRESSION: Comminuted right intertrochanteric fracture. Electronically Signed   By: Beckie SaltsSteven  Reid M.D.   On: 03/08/2016 19:28     ASSESSMENT AND PLAN:   * Right hip fracture S/p Open fixation of right hip fracture. POD # 1 Pain meds. DVT prophylaxis per orthopedics  * HTN Continue meds  * Chronic mild hyponatremia Monitor  All the records are reviewed and case discussed with Care Management/Social Workerr. Management plans discussed with the patient, family and they are in agreement.  CODE STATUS: FULL  DVT Prophylaxis: SCDs  TOTAL TIME TAKING CARE OF THIS PATIENT: 30 minutes.   POSSIBLE D/C IN 2-3 DAYS, DEPENDING ON CLINICAL CONDITION.  Milagros LollSudini, Garmon Dehn R M.D on 03/09/2016 at 1:09 PM  Between 7am to 6pm - Pager - [phone]  After 6pm go to [email or web address] - password [email or web address] [facility]  [facility] [place] [facility]  Office  [phone]  CC: Primary care physician; Gayla DossFitzgerald, David M, MD  Note: This dictation was prepared with Dragon dictation along with smaller phrase technology. Any transcriptional errors that result from this process are unintentional.

## 2016-03-09 NOTE — Interval H&P Note (Signed)
History and Physical Interval Note:  03/09/2016 8:40 AM  Lucas Melendez  has presented today for surgery, with the diagnosis of right introchanteric fracture  The various methods of treatment have been discussed with the patient and family. After consideration of risks, benefits and other options for treatment, the patient has consented to  Procedure(s): COMPRESSION HIP (Right) as a surgical intervention .  The patient's history has been reviewed, patient examined, no change in status, stable for surgery.  I have reviewed the patient's chart and labs.  Questions were answered to the patient's satisfaction.     Weber CooksKeivan Bella KennedyAbtahi

## 2016-03-09 NOTE — Transfer of Care (Addendum)
Immediate Anesthesia Transfer of Care Note  Patient: Lucas Melendez  Procedure(s) Performed: Procedure(s): COMPRESSION HIP (Right)  Patient Location: PACU  Anesthesia Type:General  Level of Consciousness: sedated  Airway & Oxygen Therapy: Patient Spontanous Breathing and Patient connected to face mask oxygen  Post-op Assessment: Report given to RN and Post -op Vital signs reviewed and stable  Post vital signs: Reviewed and stable  Last Vitals:  Vitals:   03/09/16 0754 03/09/16 1013  BP: (!) 121/56 159/70  Pulse: 78 75  Resp: 18 14  Temp: 36.8 C (P) 36.2 C    Last Pain:  Vitals:   03/09/16 0754  TempSrc: Oral  PainSc:          Complications: No apparent anesthesia complications

## 2016-03-09 NOTE — Anesthesia Procedure Notes (Signed)
Procedure Name: Intubation Date/Time: 03/09/2016 9:03 AM Performed by: Lily KocherPERALTA, Apple Dearmas Pre-anesthesia Checklist: Patient identified, Patient being monitored, Timeout performed, Emergency Drugs available and Suction available Patient Re-evaluated:Patient Re-evaluated prior to inductionOxygen Delivery Method: Circle system utilized Preoxygenation: Pre-oxygenation with 100% oxygen Intubation Type: IV induction Ventilation: Mask ventilation without difficulty Laryngoscope Size: Mac and 4 Grade View: Grade I Tube type: Oral Tube size: 7.5 mm Number of attempts: 1 Airway Equipment and Method: Stylet Placement Confirmation: ETT inserted through vocal cords under direct vision,  positive ETCO2 and breath sounds checked- equal and bilateral Secured at: 23 cm Tube secured with: Tape Dental Injury: Teeth and Oropharynx as per pre-operative assessment

## 2016-03-09 NOTE — Anesthesia Postprocedure Evaluation (Signed)
Anesthesia Post Note  Patient: Lucas Melendez  Procedure(s) Performed: Procedure(s) (LRB): COMPRESSION HIP (Right)  Patient location during evaluation: PACU Anesthesia Type: General Level of consciousness: awake and alert Pain management: pain level controlled Vital Signs Assessment: post-procedure vital signs reviewed and stable Respiratory status: spontaneous breathing, nonlabored ventilation, respiratory function stable and patient connected to nasal cannula oxygen Cardiovascular status: blood pressure returned to baseline and stable Postop Assessment: no signs of nausea or vomiting Anesthetic complications: no    Last Vitals:  Vitals:   03/09/16 1053 03/09/16 1121  BP:  123/72  Pulse:  78  Resp:  18  Temp: 36.9 C 36.6 C    Last Pain:  Vitals:   03/09/16 1121  TempSrc: Oral  PainSc:                  [name]

## 2016-03-09 NOTE — Plan of Care (Signed)
Problem: Pain Managment: Goal: General experience of comfort will improve Outcome: Progressing Pain control with iv pain medications. Pt npo for surgery in morning.

## 2016-03-09 NOTE — Consult Note (Signed)
Orthopaedic Surgery consultation  Impression: R intertrochanteric hip fracture  Plan: Nonweight bearing to affected side.  Hospitalist to risk stratify for surgical fixation.  Hold DVT prophylaxis.  Discussed risks benefits and alternatives to fixation. Questions were sought and answered. Patient wishes to proceed.   HPI: This is a 82 year old male with history of ground level fall today while getting trash outside. Patient immediately felt pain and was brought to the ED. Ambulates with no assistive device. Patient is having isolated hip pain at this time.   Past Medical History:  Diagnosis Date  . Acid reflux   . Hypertension   . Thyroid disease    hyper    No current facility-administered medications on file prior to encounter.    No current outpatient prescriptions on file prior to encounter.    No Known Allergies  Past Surgical History:  Procedure Laterality Date  . BACK SURGERY    . CARPAL TUNNEL RELEASE    . CATARACT EXTRACTION    . khyphoplasty      Social History   Social History  . Marital status: Married    Spouse name: N/A  . Number of children: N/A  . Years of education: N/A   Occupational History  . Not on file.   Social History Main Topics  . Smoking status: Never Smoker  . Smokeless tobacco: Never Used  . Alcohol use 12.6 oz/week    21 Cans of beer per week     Comment: 3 beers everynight  . Drug use: No  . Sexual activity: Not on file   Other Topics Concern  . Not on file   Social History Narrative  . No narrative on file    Family History  Problem Relation Age of Onset  . CAD Mother   . Diabetes Mother   . Throat cancer Father     Vitals:   03/09/16 0457 03/09/16 0754  BP: 121/66 (!) 121/56  Pulse: 84 78  Resp: 18 18  Temp: 98.2 F (36.8 C) 98.2 F (36.8 C)    Radiology:  AP/Lateral R hip demonstrates 2 part intertrochanteric hip fracture  Physical Exam: General: alert and interactive. No acute distress at this time.   HEENT: Trachea is midline Cardiovascular: regular rate and rhythm per palpation Respiratory: No increased work of breathing; symmetric chest rise.  MSK: No openings or abrasions noted.  +TTP at hip. +log roll. No pain at knee or ankle. +motor DF/PF/EHL +sensation L4-S1. +2/4 pulses  Contralateral LE also evaluated. No pain at hip knee or ankle. ROM well preserved.   Upper extremities: no pain at shoulder girdles elbows wrists or hands. +motor M, U, R nn intact. +sensation C5-T1. +2/4 pulses.   Impression and plan as above.

## 2016-03-09 NOTE — Evaluation (Signed)
Physical Therapy Evaluation Patient Details Name: Lucas Melendez MRN: [id number] DOB: 06-11-1930 Today's Date: 03/09/2016   History of Present Illness  Lucas Melendez is an 82yo white male who [facility] after sustaining a fall at home while climbing into a garbage can, found to have sustained a Rt hip fracture: pt presents now POD0 s/p Rt ORIF. 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   Clinical Impression  Pt admitted with above diagnosis. Pt currently with functional limitations due to the deficits listed below (see PT Problem List). Upon entry, the patient is received semirecumbent in bed, no family/caregiver present. The pt is awake and agreeable to participate. No acute distress noted at this time, pain is well managed. VSS during eval on room air, assessed by NA halfway through. The pt is alert and oriented x3, pleasant, conversational, and following simple and multi-step commands consistently. HEP training on RLE demonstrates hip range limitations and strength deficits due to pain, min-modA required to perform.  Functional mobility assessment demonstrates moderate weakness, the pt now requiring Min-mod-assist physical assistance for bed mobility and transfers, unable to perform extensive gait at this time, whereas the patient performs these independently at baseline. Pt does appear steady and stable once in a standing position with RW, denies dizziness, nausea, or major pain exacerbation. Pt will benefit from skilled PT to increase their independence and safety with mobility to allow discharge to the venue listed below.       Follow Up Recommendations Home health PT    Equipment Recommendations  Rolling walker with 5" wheels    Recommendations for Other Services       Precautions / Restrictions Precautions Precautions: Fall Restrictions Weight Bearing Restrictions: Yes RLE Weight Bearing: Weight bearing as tolerated       Mobility  Bed Mobility Overal bed mobility: Needs Assistance Bed Mobility: Supine to Sit     Supine to sit: Mod assist     General bed mobility comments: assist mostly with RLE, but also limited in trunk flexion strength.   Transfers Overall transfer level: Needs assistance Equipment used: Rolling walker (2 wheeled) Transfers: Sit to/from UGI CorporationStand;Stand Pivot Transfers Sit to Stand: From elevated surface;Min assist Stand pivot transfers: Min guard       General transfer comment: verbal cues for safe RW use.   Ambulation/Gait Ambulation/Gait assistance:  (bed to chair only, )              Stairs            Wheelchair Mobility    Modified Rankin (Stroke Patients Only)       Balance Overall balance assessment: No apparent balance deficits (not formally assessed);Needs assistance Sitting-balance support: Feet supported;No upper extremity supported Sitting balance-Leahy Scale: Good     Standing balance support: Bilateral upper extremity supported;During functional activity Standing balance-Leahy Scale: Fair                               Pertinent Vitals/Pain Pain Assessment: 0-10 Pain Score: 3  Pain Location: Rt lateral hip  Pain Descriptors / Indicators: Aching Pain Intervention(s): Limited activity within patient's tolerance;Monitored during session;Premedicated before session;Repositioned    Home Living Family/patient expects to be discharged to:: Private residence Living Arrangements: Spouse/significant other Available Help at Discharge: Family Type of Home: House Home Access: Stairs to enter Entrance Stairs-Rails: Can reach both Entrance Stairs-Number of Steps: 2 Home  Layout: One level Home Equipment: Walker - 2 wheels;Bedside commode;Cane - single point      Prior Function Level of Independence: Independent               Hand Dominance        Extremity/Trunk Assessment   Upper Extremity Assessment Upper Extremity  Assessment: Overall WFL for tasks assessed    Lower Extremity Assessment Lower Extremity Assessment: Overall WFL for tasks assessed (postoperative limitations.)       Communication   Communication: No difficulties  Cognition Arousal/Alertness: Awake/alert Behavior During Therapy: WFL for tasks assessed/performed Overall Cognitive Status: Within Functional Limits for tasks assessed                      General Comments      Exercises General Exercises - Lower Extremity Ankle Circles/Pumps: AROM;Both;15 reps Short Arc Quad: Right;AROM;10 reps Heel Slides: AAROM;Right;10 reps Hip ABduction/ADduction: AAROM;Right;10 reps   Assessment/Plan    PT Assessment Patient needs continued PT services  PT Problem List Decreased strength;Decreased range of motion;Decreased activity tolerance;Decreased balance;Decreased mobility;Pain          PT Treatment Interventions DME instruction;Gait training;Stair training;Functional mobility training;Therapeutic activities;Therapeutic exercise;Balance training;Patient/family education    PT Goals (Current goals can be found in the Care Plan section)  Acute Rehab PT Goals Patient Stated Goal: return to home with HHPT to improve gait PT Goal Formulation: With patient Time For Goal Achievement: 03/16/16 Potential to Achieve Goals: Good    Frequency BID   Barriers to discharge        Co-evaluation               End of Session   Activity Tolerance: Patient tolerated treatment well;Patient limited by pain Patient left: in chair;with call bell/phone within reach;with chair alarm set;with family/visitor present Nurse Communication: Mobility status         Time: [id number] PT Time Calculation (min) (ACUTE ONLY): 25 min   Charges:   PT Evaluation $PT Eval Low Complexity: 1 Procedure PT Treatments $Therapeutic Exercise: 8-22 mins   PT G Codes:        3:13 PM, 03/09/16 Rosamaria LintsAllan C Buccola, PT, DPT Physical Therapist -  [place] [phone](ASCOM)  [phone] (mobile)

## 2016-03-09 NOTE — Op Note (Signed)
Operative Note  Surgeon: Bernita RaisinKeivan Bralynn Velador DO  Assistants: none  Anesthesia: General endotracheal anesthesia   Preoperative Dx: Right IT fracture  Postoperative Dx: Same  Procedure Open fixation of right hip fracture Physician guided fluoroscopy <1 hour  Implants: short Synthes TFN 11x125; 105mm helical blade 5mm diameter interocking screw x1  Indications for procedure:  Lucas Melendez is an 82 year old male who sustained a ground level fall and subsequent R intertrochanteric femur fracture. After risks benefits and alternatives to procedure were discussed with family/patient, questions were sought and answered consent was signed and wished to proceed.   Patient was taken to the operative suite and placed in the supine position on a fracture table, all bony prominences were padded. She was placed under general anesthesia per anesthesiologist. The correct lower extremity was prepped and draped in the usual fashion. Patient was given preoperative antibiotics prior to incision. Traction, IR and adduction was used to perform reduction. Fluoroscopy confirmed reduction.  Time out was performed and all in the room agreed. A 3cm incision was made proximal to the greater trochanter. 3.2 mm guidewire was introduced onto the greater trochanter. An appropriate starting point was achieved using fluoroscopy and orthogonal views and was found to be center center. Once this was done, the opening reamer was placed. The ball tipped guide wire was then placed up to the level of the physeal scar. Images confirmed intramedullary placement of guide wire. Measurement was obtained. A size 11x170x125 degree mm nail was chosen. The Nail was assembled placed through the opening reamer pathway.This was malleted in gently and the nail was seated. The position was confirmed using fluoroscopy. At this time, the jig was assembled. A skin incision was made on the lateral femur through IT band. The trochar was advanced down to bone. A  guide wire was placed down the trochar while visualizing on the lateral image. This was found to be center center on orthogonal views. A measurement was taken. A drill was placed to 105 mm. A 105 helical blade was placed on the insertion handle and malleted into position. Flexible screw driver was used to tighten the proximally. The trochar for the distal interlock was placed, skin incision was made. This was drilled measured and filled with a 5.0 mm interlocking screw according to AO technique. The remaining equipment was removed including the guidewire. The handle was then removed using the acorn handle. Final images were obtained. The wounds were irrigated with copious amounts of normal saline.The subcutaneous tissue was closed using 2-0 vicryl and the skin incision was closed using staples. Sterile dressings were placed using aquacel ag bandages.   Patient recovered from general anesthesia per anesthesiologist. Tolerated procedure well without complications.   Complications: none   Blood loss :100cc  Urine output and fluids per anesthesiologist.

## 2016-03-10 LAB — BASIC METABOLIC PANEL
Anion gap: 5 (ref 5–15)
BUN: 15 mg/dL (ref 6–20)
CALCIUM: 8 mg/dL — AB (ref 8.9–10.3)
CHLORIDE: 97 mmol/L — AB (ref 101–111)
CO2: 26 mmol/L (ref 22–32)
CREATININE: 0.73 mg/dL (ref 0.61–1.24)
GFR calc non Af Amer: >60 mL/min (ref >60)
Glucose, Bld: 131 mg/dL — ABNORMAL HIGH (ref 65–99)
Potassium: 4.3 mmol/L (ref 3.5–5.1)
SODIUM: 128 mmol/L — AB (ref 135–145)

## 2016-03-10 LAB — HEMOGLOBIN: HEMOGLOBIN: 10.9 g/dL — AB (ref 13.0–18.0)

## 2016-03-10 MED ORDER — DOCUSATE SODIUM 100 MG PO CAPS
200.0000 mg | ORAL_CAPSULE | Freq: Two times a day (BID) | ORAL | Status: DC
  Administered 2016-03-10 – 2016-03-12 (×4): 200 mg via ORAL
  Filled 2016-03-10 (×5): qty 2

## 2016-03-10 MED ORDER — POLYETHYLENE GLYCOL 3350 17 G PO PACK: 17.0000 g | PACK | Freq: Every day | ORAL | Status: DC | PRN

## 2016-03-10 NOTE — Progress Notes (Signed)
[facility] - [place] at [facility]   PATIENT NAME: Lucas BatemanDelano Melendez    MR#:  [id number]  DATE OF BIRTH:  06-09-1930  SUBJECTIVE:  CHIEF COMPLAINT:   Chief Complaint  Patient presents with  . Hip Pain    Right  . Fall   No pain in his right hip at this time. Return from surgery. No shortness of breath or fever or vomiting.  REVIEW OF SYSTEMS:    Review of Systems  Constitutional: Negative for chills and fever.  HENT: Negative for sore throat.   Eyes: Negative for blurred vision, double vision and pain.  Respiratory: Negative for cough, hemoptysis, shortness of breath and wheezing.   Cardiovascular: Negative for chest pain, palpitations, orthopnea and leg swelling.  Gastrointestinal: Negative for abdominal pain, constipation, diarrhea, heartburn, nausea and vomiting.  Genitourinary: Negative for dysuria and hematuria.  Musculoskeletal: Positive for joint pain. Negative for back pain.  Skin: Negative for rash.  Neurological: Negative for sensory change, speech change, focal weakness and headaches.  Endo/Heme/Allergies: Does not bruise/bleed easily.  Psychiatric/Behavioral: Negative for depression. The patient is not nervous/anxious.     DRUG ALLERGIES:  No Known Allergies  VITALS:  Blood pressure 132/61, pulse 71, temperature 97.7 F (36.5 C), temperature source Oral, resp. rate 20, height 5\' 10"  (1.778 m), weight 99.8 kg (220 lb), SpO2 96 %.  PHYSICAL EXAMINATION:   Physical Exam  GENERAL:  82 y.o.-year-old patient lying in the bed with no acute distress.  EYES: Pupils equal, round, reactive to light and accommodation. No scleral icterus. Extraocular muscles intact.  HEENT: Head atraumatic, normocephalic. Oropharynx and nasopharynx clear.  NECK:  Supple, no jugular venous distention. No thyroid enlargement, no tenderness.  LUNGS: Normal breath sounds bilaterally, no wheezing, rales, rhonchi. No use of accessory muscles of respiration.  CARDIOVASCULAR: S1,  S2 normal. No murmurs, rubs, or gallops.  ABDOMEN: Soft, nontender, nondistended. Bowel sounds present. No organomegaly or mass.  EXTREMITIES: No cyanosis, clubbing or edema b/l.   Right hip dressing NEUROLOGIC: Cranial nerves II through XII are intact. No focal Motor or sensory deficits b/l.   PSYCHIATRIC: The patient is alert and oriented x 3.  SKIN: No obvious rash, lesion, or ulcer.   LABORATORY PANEL:   CBC  Recent Labs Lab 03/09/16 0350 03/10/16 0412  WBC 9.5  --   HGB 12.8* 10.9*  HCT 36.1*  --   PLT 245  --    ------------------------------------------------------------------------------------------------------------------ Chemistries   Recent Labs Lab 03/08/16 1837  03/10/16 0412  NA 128*  < > 128*  K 4.4  < > 4.3  CL 94*  < > 97*  CO2 26  < > 26  GLUCOSE 141*  < > 131*  BUN 16  < > 15  CREATININE 0.87  < > 0.73  CALCIUM 8.9  < > 8.0*  AST 20  --   --   ALT 15*  --   --   ALKPHOS 70  --   --   BILITOT 0.7  --   --   < > = values in this interval not displayed. ------------------------------------------------------------------------------------------------------------------  Cardiac Enzymes No results for input(s): TROPONINI in the last 168 hours. ------------------------------------------------------------------------------------------------------------------  RADIOLOGY:  Dg Chest 1 View  Result Date: 03/08/2016 CLINICAL DATA:  Right hip fracture following a fall today. EXAM: CHEST 1 VIEW COMPARISON:  Chest CT dated 02/02/2015. FINDINGS: Enlarged cardiac silhouette. Diffusely prominent interstitial markings. Thoracic spine degenerative changes. IMPRESSION: Cardiomegaly and interstitial fibrosis. Electronically Signed   By:  Beckie SaltsSteven  Reid M.D.   On: 03/08/2016 19:29   Dg Hip Operative Unilat W Or W/o Pelvis Right  Result Date: 03/09/2016 CLINICAL DATA:  Internal fixation EXAM: OPERATIVE RIGHT HIP (WITH PELVIS IF PERFORMED) 4 VIEWS TECHNIQUE: Fluoroscopic  spot image(s) were submitted for interpretation post-operatively. COMPARISON:  03/08/2016 FINDINGS: Intraoperative spot imaging demonstrates changes of internal fixation across the right femoral intertrochanteric fracture. Near anatomic alignment. No hardware complicating feature. IMPRESSION: Internal fixation without visible complicating feature. Electronically Signed   By: Charlett NoseKevin  Dover M.D.   On: 03/09/2016 10:14   Dg Hip Unilat W Or Wo Pelvis 2-3 Views Right  Result Date: 03/08/2016 CLINICAL DATA:  Right hip pain after falling today. EXAM: DG HIP (WITH OR WITHOUT PELVIS) 2-3V RIGHT COMPARISON:  None. FINDINGS: Comminuted right intertrochanteric fracture with distraction of the fragments superiorly. Diffuse osteopenia. IMPRESSION: Comminuted right intertrochanteric fracture. Electronically Signed   By: Beckie SaltsSteven  Reid M.D.   On: 03/08/2016 19:28     ASSESSMENT AND PLAN:   * Right hip fracture S/p Open fixation of right hip fracture. POD # 1 Pain meds. DVT prophylaxis per orthopedics  * HTN Continue meds  * Chronic mild hyponatremia Monitor  All the records are reviewed and case discussed with Care Management/Social Workerr. Management plans discussed with the patient, family and they are in agreement.  CODE STATUS: FULL  DVT Prophylaxis: SCDs  TOTAL TIME TAKING CARE OF THIS PATIENT: 30 minutes.   Improving slowly. Likely discharge tomorrow home with home health.  Milagros LollSudini, Lucas Melendez R M.D on 03/10/2016 at 1:01 PM  Between 7am to 6pm - Pager - [phone]  After 6pm go to [email or web address] - password [email or web address] [facility]  [facility] [place] [facility]  Office  [phone]  CC: Primary care physician; Gayla DossFitzgerald, David M, MD  Note: This dictation was prepared with Dragon dictation along with smaller phrase technology. Any transcriptional errors that result from this process are unintentional.

## 2016-03-10 NOTE — Consult Note (Signed)
Orthopaedic Progress Note  Subjective: Overnight patient doing well. Pain controlled. Was able to get up with physical therapy and ambulate a few feet. No chest pain or shortness of breath.    Objective:   Vitals:   03/10/16 0450 03/10/16 0748  BP: (!) 121/53 132/61  Pulse: 71 71  Resp: 20   Temp: 98.1 F (36.7 C) 97.7 F (36.5 C)   Hgb 10.9 Na 128  Physical Exam:  General: alert and interactive. No acute distress MSK:  RLE: dressing clean and dry. +motor DF/PF/EHL. +sensation L4-S1. +2/4 pulses Calves soft and Nontender LLE: No pain at hip knee or ankle. ROM preserved. +motor/sensation.   Assessment: POD 1 s/p R hip CMN  Plan:  Weight bearing as tolerated to R lower extremity.  Physical Therapy to assist with gait trainging, strength and balance. DVT prophylaxis-Lovenox x 30 days.  Hyponatremia-Hospitalist addressing.  Case management to see for possible rehab versus home with home health. Follow up in 10-14 days with Ortho for staple removal.

## 2016-03-11 ENCOUNTER — Encounter: Payer: Self-pay | Admitting: Orthopaedic Surgery

## 2016-03-11 LAB — BASIC METABOLIC PANEL
ANION GAP: 7 (ref 5–15)
BUN: 12 mg/dL (ref 6–20)
CHLORIDE: 98 mmol/L — AB (ref 101–111)
CO2: 26 mmol/L (ref 22–32)
Calcium: 8.4 mg/dL — ABNORMAL LOW (ref 8.9–10.3)
Creatinine, Ser: 0.71 mg/dL (ref 0.61–1.24)
GFR calc non Af Amer: >60 mL/min (ref >60)
GLUCOSE: 147 mg/dL — AB (ref 65–99)
POTASSIUM: 4.3 mmol/L (ref 3.5–5.1)
Sodium: 131 mmol/L — ABNORMAL LOW (ref 135–145)

## 2016-03-11 LAB — HEMOGLOBIN: Hemoglobin: 10.7 g/dL — ABNORMAL LOW (ref 13.0–18.0)

## 2016-03-11 MED ORDER — OXYCODONE HCL 5 MG PO TABS: 5.0000 mg | ORAL_TABLET | ORAL | 0 refills | Status: DC | PRN

## 2016-03-11 MED ORDER — ENOXAPARIN SODIUM 40 MG/0.4ML ~~LOC~~ SOLN: 40.0000 mg | SUBCUTANEOUS | 0 refills | Status: DC

## 2016-03-11 MED ORDER — BISACODYL 5 MG PO TBEC
10.0000 mg | DELAYED_RELEASE_TABLET | Freq: Once | ORAL | Status: AC
  Administered 2016-03-11: 10 mg via ORAL
  Filled 2016-03-11: qty 2

## 2016-03-11 NOTE — Progress Notes (Signed)
Physical Therapy Treatment Patient Details Name: Lucas Melendez MRN: [id number] DOB: 06-08-1930 Today's Date: 03/11/2016    History of Present Illness Lucas Melendez is an 82yo white male who comes to [facility] on 12/15 after sustaining a fall at home while climbing into a garbage can, found to have sustained a Rt hip fracture: pt presents now POD0 s/p Rt ORIF. 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     PT Comments    Intermittent buckling of R LE in loading, tends to maintain in partial flexion throughout gait cycle.  Good response to cuing/therex designed to emphasize closed-chain R TKE; will continue to reinforce throughout remaining hospitalization.   Follow Up Recommendations  Home health PT     Equipment Recommendations       Recommendations for Other Services       Precautions / Restrictions Precautions Precautions: Fall Restrictions Weight Bearing Restrictions: Yes RLE Weight Bearing: Weight bearing as tolerated    Mobility  Bed Mobility               General bed mobility comments: Seated in recliner beginning/end of session  Transfers Overall transfer level: Needs assistance Equipment used: Rolling walker (2 wheeled) Transfers: Sit to/from Stand Sit to Stand: Min guard         General transfer comment: heavy use of bilat UEs with lift off and overall movement transition  Ambulation/Gait Ambulation/Gait assistance: Min guard;Min assist Ambulation Distance (Feet): 75 Feet Assistive device: Rolling walker (2 wheeled)       General Gait Details: step to gait pattern with heavy WBing bilat UEs; min cuing for R TKE in loading phases of gait cycle   Stairs            Wheelchair Mobility    Modified Rankin (Stroke Patients Only)       Balance Overall balance assessment: Needs assistance Sitting-balance support: No upper extremity supported;Feet supported Sitting balance-Leahy Scale: Good      Standing balance support: Bilateral upper extremity supported Standing balance-Leahy Scale: Fair                      Cognition Arousal/Alertness: Awake/alert Behavior During Therapy: WFL for tasks assessed/performed Overall Cognitive Status: Within Functional Limits for tasks assessed                      Exercises Other Exercises Other Exercises: Sit/stand x5 with RW, cga/min assist-cuing for hand placement, weight shift and overall technique with movement transition.  Limited use of R LE ; unable to complete without bilat UE support. Other Exercises: Standing LE therex, 1x10, with RW, cga: R closed-chain TKE; L LE forward/retro, lateral stepping, emphasis on R knee control in modified SLS during L LE advancemen    General Comments        Pertinent Vitals/Pain Pain Assessment: 0-10 Pain Score: 5  Pain Location: R hip/knee Pain Descriptors / Indicators: Aching Pain Intervention(s): Limited activity within patient's tolerance;Monitored during session;Premedicated before session    Home Living                      Prior Function            PT Goals (current goals can now be found in the care plan section) Acute Rehab PT Goals Patient Stated Goal: return to home with HHPT to improve gait PT Goal Formulation: With patient Time For Goal Achievement: 03/16/16  Potential to Achieve Goals: Good Progress towards PT goals: Progressing toward goals    Frequency    BID      PT Plan Current plan remains appropriate    Co-evaluation             End of Session Equipment Utilized During Treatment: Gait belt Activity Tolerance: Patient tolerated treatment well Patient left: in chair;with call bell/phone within reach;with chair alarm set;with family/visitor present     Time: [id number] PT Time Calculation (min) (ACUTE ONLY): 26 min  Charges:  $Gait Training: 8-22 mins $Therapeutic Exercise: 8-22 mins                    G Codes:       Janelis Stelzer H. Manson PasseyBrown, PT, DPT, NCS 03/11/16, 11:14 PM [phone]

## 2016-03-11 NOTE — Discharge Instructions (Signed)
Keep incision sites clean and dry. May maintain dressing and take shower. Leave dressing intact x 7 days unless dressing becomes saturated. May remove and change with gauze and tegaderm or tape. Do not soak, no baths. Weight bearing as tolerated to right lower extremity with front wheeled walker and assistance.  Remove staples at 2 weeks. Apply Steri-Strips and bandages. Follow-up at 6 weeks with Dr. Ernest PineHooten. Lovenox 40 mg subcutaneous x 30 days for DVT prophylaxis. Take pain medications as prescribed.   INSTRUCTIONS AFTER Surgery  o Remove items at home which could result in a fall. This includes throw rugs or furniture in walking pathways o ICE to the affected joint every three hours while awake for 30 minutes at a time, for at least the first 3-5 days, and then as needed for pain and swelling.  Continue to use ice for pain and swelling. You may notice swelling that will progress down to the foot and ankle.  This is normal after surgery.  Elevate your leg when you are not up walking on it.   o Continue to use the breathing machine you got in the hospital (incentive spirometer) which will help keep your temperature down.  It is common for your temperature to cycle up and down following surgery, especially at night when you are not up moving around and exerting yourself.  The breathing machine keeps your lungs expanded and your temperature down.   DIET:  As you were doing prior to hospitalization, we recommend a well-balanced diet.  DRESSING / WOUND CARE / SHOWERING  Watch the wound for increased drainage. Waterproof adhesions can be utilized to allow showering. Change dressing every 3-5 days as needed. Staples will be removed at 2 weeks with application of Steri-Strips.  ACTIVITY  o Increase activity slowly as tolerated, but follow the weight bearing instructions below.   o No driving for 6 weeks or until further direction given by your physician.  You cannot drive while taking narcotics.  o No  lifting or carrying greater than 10 lbs. until further directed by your surgeon. o Avoid periods of inactivity such as sitting longer than an hour when not asleep. This helps prevent blood clots.  o You may return to work once you are authorized by your doctor.     WEIGHT BEARING  Weight-bearing as tolerated   EXERCISES Range of motion and strengthening as well as gait training with physical therapy and occupational therapy.  CONSTIPATION  Constipation is defined medically as fewer than three stools per week and severe constipation as less than one stool per week.  Even if you have a regular bowel pattern at home, your normal regimen is likely to be disrupted due to multiple reasons following surgery.  Combination of anesthesia, postoperative narcotics, change in appetite and fluid intake all can affect your bowels.   YOU MUST use at least one of the following options; they are listed in order of increasing strength to get the job done.  They are all available over the counter, and you may need to use some, POSSIBLY even all of these options:    Drink plenty of fluids (prune juice may be helpful) and high fiber foods Colace 100 mg by mouth twice a day  Senokot for constipation as directed and as needed Dulcolax (bisacodyl), take with full glass of water  Miralax (polyethylene glycol) once or twice a day as needed.  If you have tried all these things and are unable to have a bowel movement in the  first 3-4 days after surgery call either your surgeon or your primary doctor.    If you experience loose stools or diarrhea, hold the medications until you stool forms back up.  If your symptoms do not get better within 1 week or if they get worse, check with your doctor.  If you experience "the worst abdominal pain ever" or develop nausea or vomiting, please contact the office immediately for further recommendations for treatment.   ITCHING:  If you experience itching with your medications, try  taking only a single pain pill, or even half a pain pill at a time.  You can also use Benadryl over the counter for itching or also to help with sleep.   TED HOSE STOCKINGS:  Use stockings on both legs until for at least 2 weeks or as directed by physician office. They may be removed at night for sleeping.  MEDICATIONS:  See your medication summary on the After Visit Summary that nursing will review with you.  You may have some home medications which will be placed on hold until you complete the course of blood thinner medication.  It is important for you to complete the blood thinner medication as prescribed.  PRECAUTIONS:  If you experience chest pain or shortness of breath - call 911 immediately for transfer to the hospital emergency department.   If you develop a fever greater that 101 F, purulent drainage from wound, increased redness or drainage from wound, foul odor from the wound/dressing, or calf pain - CONTACT YOUR SURGEON.                                                   FOLLOW-UP APPOINTMENTS:  If you do not already have a post-op appointment, please call the office for an appointment to be seen by your surgeon.  Guidelines for how soon to be seen are listed in your After Visit Summary, but are typically between 1-4 weeks after surgery.  OTHER INSTRUCTIONS:     MAKE SURE YOU:   Understand these instructions.   Get help right away if you are not doing well or get worse.    Thank you for letting us be a part of your medical care team.  It is a privilege we respect greatly.  We hope these instructions will help you stay on track for a fast and full recovery!

## 2016-03-11 NOTE — Progress Notes (Signed)
[facility] - [place] [facility]   PATIENT NAME: Lucas BatemanDelano Melendez    MR#:  [id number]  DATE OF BIRTH:  [birth year]  SUBJECTIVE:  CHIEF COMPLAINT:   Chief Complaint  Patient presents with  . Hip Pain    Right  . Fall   No pain in his right hip at this time.  No shortness of breath or fever or vomiting. Sitting in chair  REVIEW OF SYSTEMS:    Review of Systems  Constitutional: Negative for chills and fever.  HENT: Negative for sore throat.   Eyes: Negative for blurred vision, double vision and pain.  Respiratory: Negative for cough, hemoptysis, shortness of breath and wheezing.   Cardiovascular: Negative for chest pain, palpitations, orthopnea and leg swelling.  Gastrointestinal: Negative for abdominal pain, constipation, diarrhea, heartburn, nausea and vomiting.  Genitourinary: Negative for dysuria and hematuria.  Musculoskeletal: Positive for joint pain. Negative for back pain.  Skin: Negative for rash.  Neurological: Negative for sensory change, speech change, focal weakness and headaches.  Endo/Heme/Allergies: Does not bruise/bleed easily.  Psychiatric/Behavioral: Negative for depression. The patient is not nervous/anxious.     DRUG ALLERGIES:  No Known Allergies  VITALS:  Blood pressure (!) 144/67, pulse 82, temperature 98.4 F (36.9 C), temperature source Oral, resp. rate 17, height 5\' 10"  (1.778 m), weight 99.8 kg (220 lb), SpO2 95 %.  PHYSICAL EXAMINATION:   Physical Exam  GENERAL:  82 y.o.-year-old patient lying in the bed with no acute distress.  EYES: Pupils equal, round, reactive to light and accommodation. No scleral icterus. Extraocular muscles intact.  HEENT: Head atraumatic, normocephalic. Oropharynx and nasopharynx clear.  NECK:  Supple, no jugular venous distention. No thyroid enlargement, no tenderness.  LUNGS: Normal breath sounds bilaterally, no wheezing, rales, rhonchi. No use of accessory muscles of respiration.  CARDIOVASCULAR:  S1, S2 normal. No murmurs, rubs, or gallops.  ABDOMEN: Soft, nontender, nondistended. Bowel sounds present. No organomegaly or mass.  EXTREMITIES: No cyanosis, clubbing or edema b/l.   Right hip dressing NEUROLOGIC: Cranial nerves II through XII are intact. No focal Motor or sensory deficits b/l.   PSYCHIATRIC: The patient is alert and oriented x 3.  SKIN: No obvious rash, lesion, or ulcer.   LABORATORY PANEL:   CBC  Recent Labs Lab 03/09/16 0350  03/11/16 0328  WBC 9.5  --   --   HGB 12.8*  < > 10.7*  HCT 36.1*  --   --   PLT 245  --   --   < > = values in this interval not displayed. ------------------------------------------------------------------------------------------------------------------ Chemistries   Recent Labs Lab 03/08/16 1837  03/11/16 0328  NA 128*  < > 131*  K 4.4  < > 4.3  CL 94*  < > 98*  CO2 26  < > 26  GLUCOSE 141*  < > 147*  BUN 16  < > 12  CREATININE 0.87  < > 0.71  CALCIUM 8.9  < > 8.4*  AST 20  --   --   ALT 15*  --   --   ALKPHOS 70  --   --   BILITOT 0.7  --   --   < > = values in this interval not displayed. ------------------------------------------------------------------------------------------------------------------  Cardiac Enzymes No results for input(s): TROPONINI in the last 168 hours. ------------------------------------------------------------------------------------------------------------------  RADIOLOGY:  No results found.   ASSESSMENT AND PLAN:   * Right hip fracture S/p Open fixation of right hip fracture. POD # 2 Pain meds. DVT  prophylaxis per orthopedics  * Acute blood loss anemia s/p surgery stable  * HTN Continue meds  * Chronic mild hyponatremia Monitor  All the records are reviewed and case discussed with Care Management/Social Workerr. Management plans discussed with the patient, family and they are in agreement.  CODE STATUS: FULL  DVT Prophylaxis: SCDs  TOTAL TIME TAKING CARE OF THIS  PATIENT: 30 minutes.   D/C in 1-2 days HH vs SNF  Milagros LollSudini, [name] R M.D on 03/11/2016 at 11:38 AM  Between 7am to 6pm - Pager - [phone]  After 6pm go to [email or web address] - password [email or web address] [facility]  [facility] [place] [facility]  Office  [phone]  CC: Primary care physician; Gayla DossFitzgerald, David M, MD  Note: This dictation was prepared with Dragon dictation along with smaller phrase technology. Any transcriptional errors that result from this process are unintentional.

## 2016-03-11 NOTE — Progress Notes (Signed)
Physical Therapy Treatment Patient Details Name: Lucas Melendez MRN: [id number] DOB: 1930-10-31 Today's Date: 03/11/2016    History of Present Illness Lucas Melendez is an 82yo white male who [facility] after sustaining a fall at home while climbing into a garbage can, found to have sustained a Rt hip fracture: pt presents now POD0 s/p Rt ORIF. 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     PT Comments    Pt agreeable to PT; pain manageable, but increases slightly with ambulation. Pt progressing ambulation distance; requires cues for improved quality (unsure how much is baseline). Pt notes he has ramp to place over 2 STE at home and will have son in law do so. PT demonstrates good understanding of long sit exercises. Continue PT to progress strength, endurance and quality of ambulation to allow safe return home.   Follow Up Recommendations  Home health PT     Equipment Recommendations       Recommendations for Other Services       Precautions / Restrictions Restrictions Weight Bearing Restrictions: Yes RLE Weight Bearing: Weight bearing as tolerated    Mobility  Bed Mobility               General bed mobility comments: Not tested; pt up in chair  Transfers Overall transfer level: Needs assistance Equipment used: Rolling walker (2 wheeled) Transfers: Sit to/from Stand Sit to Stand: Min assist         General transfer comment: Slow to rise/sit; limited use of RLE  Ambulation/Gait Ambulation/Gait assistance: Min guard Ambulation Distance (Feet): 80 Feet Assistive device: Rolling walker (2 wheeled) Gait Pattern/deviations: Step-through pattern;Narrow base of support Gait velocity: reduced Gait velocity interpretation: Below normal speed for age/gender General Gait Details: B feet with increased ER, occasionally striking opposite foot with swing phase. Encouraged increased BOS for balance and to avoid  striking feet   Stairs            Wheelchair Mobility    Modified Rankin (Stroke Patients Only)       Balance Overall balance assessment: Needs assistance Sitting-balance support: Feet supported Sitting balance-Leahy Scale: Good     Standing balance support: Bilateral upper extremity supported;During functional activity Standing balance-Leahy Scale: Fair                      Cognition Arousal/Alertness: Awake/alert Behavior During Therapy: WFL for tasks assessed/performed Overall Cognitive Status: Within Functional Limits for tasks assessed                      Exercises General Exercises - Lower Extremity Ankle Circles/Pumps: AROM;Both;20 reps Quad Sets: Strengthening;Both;20 reps Gluteal Sets: Strengthening;Both;20 reps Heel Slides: AROM;Both;10 reps Hip ABduction/ADduction: AAROM;Both;10 reps    General Comments        Pertinent Vitals/Pain Pain Assessment: 0-10 Pain Score: 4  (5 with ambulation) Pain Location: R hip/knee Pain Intervention(s): Monitored during session;Limited activity within patient's tolerance    Home Living                      Prior Function            PT Goals (current goals can now be found in the care plan section) Progress towards PT goals: Progressing toward goals    Frequency    BID      PT Plan Current plan remains appropriate    Co-evaluation  End of Session   Activity Tolerance: Patient tolerated treatment well Patient left: in chair;with call bell/phone within reach;with chair alarm set;with family/visitor present     Time: [id number] PT Time Calculation (min) (ACUTE ONLY): 30 min  Charges:  $Gait Training: 8-22 mins $Therapeutic Exercise: 8-22 mins                    G Codes:      Scot DockHeidi E Derrell Milanes, PTA 03/11/2016, 1:08 PM

## 2016-03-11 NOTE — Care Management Note (Signed)
Case Management Note  Patient Details  Name: Lucas Melendez MRN: [id number] Date of Birth: 1930-09-23  Subjective/Objective:  CM consult for discharge planning. Met with patient, his daughter and his wife at bedside. Patient is sitting up in chair. Prior to admission he required no assistive devices. Offered choice of home health agencies and wife prefers [facility] for HHPT. Will need a walker. [facility]:  [phone]. Called Lovenox 40 mg #14, no refills. PCP is Dr. Ola Spurr. He lives at home with wife and has family that will help care for him.   Action/Plan: Walker to be delivered. [facility] for HHPT. Lovenox called in.   Expected Discharge Date:  03/12/16               Expected Discharge Plan:  [place]  In-House Referral:     Discharge planning Services  CM Consult  Post Acute Care Choice:  Durable Medical Equipment, Home Health Choice offered to:  Spouse, Patient  DME Arranged:  Walker rolling DME Agency:  [place]:  PT [facility] Agency:  [facility]  Status of Service:  In process, will continue to follow  If discussed at Long Length of Stay Meetings, dates discussed:    Additional Comments:  Jolly Mango, RN 03/11/2016, 3:28 PM

## 2016-03-11 NOTE — Care Management (Signed)
Walker delivered

## 2016-03-11 NOTE — Progress Notes (Signed)
  Subjective: 2 Days Post-Op Procedure(s) (LRB): COMPRESSION HIP (Right) Patient reports pain as mild.   Patient seen in rounds with Dr. Ernest PineHooten. Patient is well, and has had no acute complaints or problems Plan is to go Rehab versus possible home after hospital stay. This will depend on his physical therapy and internal medicine. Negative for chest pain and shortness of breath Fever: no Gastrointestinal: Negative for nausea and vomiting  Objective: Vital signs in last 24 hours: Temp:  [97.7 F (36.5 C)-98.8 F (37.1 C)] 98.6 F (37 C) (12/18 0441) Pulse Rate:  [71-79] 78 (12/18 0441) Resp:  [18-20] 18 (12/18 0441) BP: (124-145)/(56-61) 124/58 (12/18 0441) SpO2:  [95 %-98 %] 95 % (12/18 0441)  Intake/Output from previous day:  Intake/Output Summary (Last 24 hours) at 03/11/16 0647 Last data filed at 03/11/16 0100  Gross per 24 hour  Intake              480 ml  Output             3300 ml  Net            -2820 ml    Intake/Output this shift: Total I/O In: -  Out: 1700 [Urine:1700]  Labs:  Recent Labs  03/08/16 1837 03/09/16 0350 03/10/16 0412 03/11/16 0328  HGB 14.4 12.8* 10.9* 10.7*    Recent Labs  03/08/16 1837 03/09/16 0350  WBC 10.0 9.5  RBC 4.34* 3.83*  HCT 41.6 36.1*  PLT 290 245    Recent Labs  03/10/16 0412 03/11/16 0328  NA 128* 131*  K 4.3 4.3  CL 97* 98*  CO2 26 26  BUN 15 12  CREATININE 0.73 0.71  GLUCOSE 131* 147*  CALCIUM 8.0* 8.4*   No results for input(s): LABPT, INR in the last 72 hours.   EXAM General - Patient is Alert and Oriented Extremity - Neurovascular intact Dorsiflexion/Plantar flexion intact No cellulitis present Compartment soft Dressing/Incision - clean, dry, scant drainage on proximal bandage Motor Function - intact, moving foot and toes well on exam.   Past Medical History:  Diagnosis Date  . Acid reflux   . Hypertension   . Thyroid disease    hyper    Assessment/Plan: 2 Days Post-Op Procedure(s)  (LRB): COMPRESSION HIP (Right) Active Problems:   Hip fracture requiring operative repair, right, closed, initial encounter (HCC)  Estimated body mass index is 31.57 kg/m as calculated from the following:   Height as of this encounter: 5\' 10"  (1.778 m).   Weight as of this encounter: 99.8 kg (220 lb). [facility] diet Up with therapy D/C IV fluids Plan for discharge tomorrow if cleared by medicine.  DVT Prophylaxis - Lovenox, Foot Pumps and TED hose Weight-Bearing as tolerated to right leg  Dedra Skeensodd Anea Fodera, PA-C Orthopaedic Surgery 03/11/2016, 6:47 AM

## 2016-03-11 NOTE — Progress Notes (Signed)
Rn spoke with family about patient needing family supervision in order to discontinue sitter for discharge planning. Family agreed to stay with patient around the clock until tomorrow. MD discontinued sitter order.   Harvie HeckMelanie Thadd Apuzzo, RN

## 2016-03-12 LAB — BASIC METABOLIC PANEL
ANION GAP: 7 (ref 5–15)
BUN: 12 mg/dL (ref 6–20)
CO2: 27 mmol/L (ref 22–32)
Calcium: 8.4 mg/dL — ABNORMAL LOW (ref 8.9–10.3)
Chloride: 95 mmol/L — ABNORMAL LOW (ref 101–111)
Creatinine, Ser: 0.72 mg/dL (ref 0.61–1.24)
GFR calc non Af Amer: >60 mL/min (ref >60)
GLUCOSE: 147 mg/dL — AB (ref 65–99)
POTASSIUM: 4.3 mmol/L (ref 3.5–5.1)
Sodium: 129 mmol/L — ABNORMAL LOW (ref 135–145)

## 2016-03-12 NOTE — Care Management Note (Signed)
Case Management Note  Patient Details  Name: Penny PiaDelano A Pingleton MRN: [id number] Date of Birth: 1931-01-09  Subjective/Objective:  Cost of Lovenox is $ 6.                  Action/Plan: [facility] notified of discharge. Walker delivered.   Expected Discharge Date:  03/12/16               Expected Discharge Plan:  Home w Home Health Services  In-House Referral:     Discharge planning Services  CM Consult  Post Acute Care Choice:  Durable Medical Equipment, Home Health Choice offered to:  Spouse, Patient  DME Arranged:  Walker rolling DME Agency:  [facility]  HH Arranged:  PT [facility] Agency:  [facility]  Status of Service:  Completed, signed off  If discussed at Long Length of Stay Meetings, dates discussed:    Additional Comments:  [name] M Keyondre Hepburn, RN 03/12/2016, 9:30 AM

## 2016-03-12 NOTE — Progress Notes (Signed)
  Subjective: 3 Days Post-Op Procedure(s) (LRB): COMPRESSION HIP (Right) Patient reports pain as mild.   Patient seen in rounds with Dr. Ernest PineHooten. Patient is well, and has had no acute complaints or problems Plan is to go home with home health physical therapy after hospital stay. Negative for chest pain and shortness of breath Fever: Low-grade at 99.0 Gastrointestinal: Negative for nausea and vomiting. The patient has had a bowel movement.   Objective: Vital signs in last 24 hours: Temp:  [97.4 F (36.3 C)-99 F (37.2 C)] 99 F (37.2 C) (12/19 0348) Pulse Rate:  [66-84] 72 (12/19 0348) Resp:  [16-17] 16 (12/19 0348) BP: (125-146)/(55-67) 125/55 (12/19 0348) SpO2:  [94 %-98 %] 94 % (12/19 0348)  Intake/Output from previous day:  Intake/Output Summary (Last 24 hours) at 03/12/16 0645 Last data filed at 03/12/16 0233  Gross per 24 hour  Intake              520 ml  Output             1375 ml  Net             -855 ml    Intake/Output this shift: Total I/O In: -  Out: 800 [Urine:800]  Labs:  Recent Labs  03/10/16 0412 03/11/16 0328  HGB 10.9* 10.7*   No results for input(s): WBC, RBC, HCT, PLT in the last 72 hours.  Recent Labs  03/11/16 0328 03/12/16 0341  NA 131* 129*  K 4.3 4.3  CL 98* 95*  CO2 26 27  BUN 12 12  CREATININE 0.71 0.72  GLUCOSE 147* 147*  CALCIUM 8.4* 8.4*   No results for input(s): LABPT, INR in the last 72 hours.   EXAM General - Patient is Alert and Oriented Extremity - Neurovascular intact Dorsiflexion/Plantar flexion intact No cellulitis present Compartment soft Dressing/Incision - clean, dry,And intact.  Motor Function - intact, moving foot and toes well on exam. The patient ambulated 75 feet with physical therapy.  Past Medical History:  Diagnosis Date  . Acid reflux   . Hypertension   . Thyroid disease    hyper    Assessment/Plan: 3 Days Post-Op Procedure(s) (LRB): COMPRESSION HIP (Right) Active Problems:   Hip  fracture requiring operative repair, right, closed, initial encounter (HCC)  Estimated body mass index is 31.57 kg/m as calculated from the following:   Height as of this encounter: 5\' 10"  (1.778 m).   Weight as of this encounter: 99.8 kg (220 lb). Up with physical therapy today. Plan on discharge home with home health physical therapy today.   DVT Prophylaxis - Lovenox, Foot Pumps and TED hose Weight-Bearing as tolerated to right leg  Dedra Skeensodd Thijs Brunton, PA-C Orthopaedic Surgery 03/12/2016, 6:45 AM

## 2016-03-12 NOTE — Progress Notes (Signed)
DISCHARGE NOTE:  Pt given discharge instructions and prescriptions (oxycodone) , pt verbalized understanding. Pt wheeled to car by staff.

## 2016-03-12 NOTE — Progress Notes (Signed)
Physical Therapy Treatment Patient Details Name: Lucas Melendez A Swartzentruber MRN: [id number] DOB: 1930/05/23 Today's Date: 03/12/2016    History of Present Illness [name] is an 82yo white male [facility] after sustaining a fall at home while climbing into a garbage can, found to have sustained a Rt hip fracture: pt presents now POD0 s/p Rt ORIF. 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     PT Comments    Pt agreeable to PT; 4/10 pain in R hip/knee. Pt requires Min A for bed mobility. Received new rolling walker, which was adjusted this session for appropriate fit. Transfers sit to stand, continue to be moderately effortful, but manages without overt loss of balance. Ambulation with step to pattern and cues required for improved placement of rolling walker to allow for equal step lengths and improved balance. No knee buckling or overt loss of balance. Pt does require safety cues to step around when turning versus pivoting on feet. Pt participates with seated and long sit exercises and educated on carryover at home. Pt to be discharged home with home health PT to continue progress of strength, endurance, balance, safety and ambulation for improved functional mobility.   Follow Up Recommendations  Home health PT     Equipment Recommendations  Rolling walker with 5" wheels (received and adjusted to fit)    Recommendations for Other Services       Precautions / Restrictions Precautions Precautions: Fall Restrictions Weight Bearing Restrictions: Yes RLE Weight Bearing: Weight bearing as tolerated    Mobility  Bed Mobility Overal bed mobility: Needs Assistance Bed Mobility: Supine to Sit     Supine to sit: Min assist     General bed mobility comments: Assist for RLE to edge of bed; slightly more for elevating trunk  Transfers Overall transfer level: Needs assistance Equipment used: Rolling walker (2 wheeled) Transfers:  Sit to/from Stand Sit to Stand: Min guard         General transfer comment: Slwo to rise/sit; cues for safe hand placement. Mildly unsteady, but no overt LOB  Ambulation/Gait Ambulation/Gait assistance: Min guard Ambulation Distance (Feet): 60 Feet Assistive device: Rolling walker (2 wheeled) Gait Pattern/deviations: Step-to pattern;Narrow base of support Gait velocity: reduced Gait velocity interpretation: <1.8 ft/sec, indicative of risk for recurrent falls General Gait Details: Initially mildly unsteady; improves with continued walking. Encouraged rw placed out a little further for improved ability to manage step lengths and balance   Stairs            Wheelchair Mobility    Modified Rankin (Stroke Patients Only)       Balance Overall balance assessment: Needs assistance Sitting-balance support: No upper extremity supported Sitting balance-Leahy Scale: Good     Standing balance support: Bilateral upper extremity supported Standing balance-Leahy Scale: Fair                      Cognition Arousal/Alertness: Awake/alert Behavior During Therapy: WFL for tasks assessed/performed Overall Cognitive Status: Within Functional Limits for tasks assessed                      Exercises General Exercises - Lower Extremity Ankle Circles/Pumps: AROM;Both;20 reps Quad Sets: Strengthening;Both;20 reps Gluteal Sets: Strengthening;Both;20 reps Long Arc Quad: AAROM;Right;10 reps;Seated Heel Slides: AROM;AAROM;Right;15 reps Hip ABduction/ADduction: AAROM;Right;15 reps Hip Flexion/Marching: AAROM;Right;15 reps;Seated    General Comments        Pertinent Vitals/Pain Pain  Assessment: 0-10 Pain Score: 4  Pain Location: R hip/knee Pain Intervention(s): Premedicated before session;Repositioned    Home Living                      Prior Function            PT Goals (current goals can now be found in the care plan section) Progress towards PT  goals: Progressing toward goals    Frequency    BID      PT Plan Current plan remains appropriate    Co-evaluation             End of Session Equipment Utilized During Treatment: Gait belt Activity Tolerance: Patient tolerated treatment well Patient left: in chair;with call bell/phone within reach;with chair alarm set     Time: [phone] PT Time Calculation (min) (ACUTE ONLY): 25 min  Charges:  $Gait Training: 8-22 mins $Therapeutic Exercise: 8-22 mins                    G CodesScot Dock:      Heidi E Barnes, PTA 03/12/2016, 9:48 AM

## 2016-03-12 NOTE — Care Management Important Message (Signed)
Important Message  Patient Details  Name: Lucas Melendez MRN: [id number] Date of Birth: 11/09/1930   Medicare Important Message Given:  Yes    [name] M Delsa Walder, RN 03/12/2016, 9:38 AM

## 2016-03-21 NOTE — Discharge Summary (Signed)
[facility] [place] [facility]   PATIENT NAME: Lucas Melendez    MR#:  [id number]  DATE OF BIRTH:  [birth year]  DATE OF ADMISSION:  03/08/2016 ADMITTING PHYSICIAN: Alford Highlandichard Wieting, MD  DATE OF DISCHARGE: 03/12/2016 11:22 AM  PRIMARY CARE PHYSICIAN: Gayla DossFitzgerald, David M, MD   ADMISSION DIAGNOSIS:  Fall, initial encounter [phone][W19.XXXA] Closed fracture of right hip, initial encounter (HCC) [S72.001A]  DISCHARGE DIAGNOSIS:  Active Problems:   Hip fracture requiring operative repair, right, closed, initial encounter (HCC)   SECONDARY DIAGNOSIS:   Past Medical History:  Diagnosis Date  . Acid reflux   . Hypertension   . Thyroid disease    hyper     ADMITTING HISTORY  HISTORY OF PRESENT ILLNESS:  Lucas BatemanDelano Topping  is a 82 y.o. male presents with right hip pain. He threw stuff in the garbage can that he thought his wife may have wanted and he was reaching into the garbage and he got tangled up and fell on his hip and couldn't get up. The neighbors helped him to get into the house and he couldn't straighten out his leg. Having a good amount of pain in his right hip and came into the hospital for further evaluation. He was found to have a right hip fracture. No complaints of chest pain or shortness of breath. Good exercise capacity when he walks around.    HOSPITAL COURSE:   * Right hip fracture S/p Open fixation of righthip fracture. POD # 3 Pain meds. DVT prophylaxis With Lovenox after discharge  * Acute blood loss anemia s/p surgery Stable No need for transfusion  * HTN Continue meds  * Chronic mild hyponatremia Stable  Patient worked with physical therapy and elevated well. Home health physical therapy has been recommended which is set up. Prescriptions for pain medications and Lovenox given.  Stable for discharge home to follow-up with primary care physician and orthopedics.  CONSULTS OBTAINED:  Treatment Team:  Donato HeinzJames P Hooten, MD  DRUG  ALLERGIES:  No Known Allergies  DISCHARGE MEDICATIONS:   Discharge Medication List as of 03/12/2016  9:46 AM    START taking these medications   Details  enoxaparin (LOVENOX) 40 MG/0.4ML injection Inject 0.4 mLs (40 mg total) into the skin daily., Starting Mon 03/11/2016, Normal    oxyCODONE (OXY IR/ROXICODONE) 5 MG immediate release tablet Take 1 tablet (5 mg total) by mouth every 4 (four) hours as needed for moderate pain., Starting Mon 03/11/2016, Print      CONTINUE these medications which have NOT CHANGED   Details  aspirin EC 81 MG tablet Take 81 mg by mouth daily., Historical Med    levothyroxine (SYNTHROID, LEVOTHROID) 150 MCG tablet Take 150 mcg by mouth daily before breakfast., Historical Med    lisinopril (PRINIVIL,ZESTRIL) 40 MG tablet Take 40 mg by mouth daily., Historical Med    simvastatin (ZOCOR) 10 MG tablet Take 1 tablet by mouth daily., Starting Mon 01/01/2016, Historical Med        Today   VITAL SIGNS:  Blood pressure 134/63, pulse 84, temperature 97.9 F (36.6 C), temperature source Oral, resp. rate 16, height 5\' 10"  (1.778 m), weight 99.8 kg (220 lb), SpO2 97 %.  I/O:  No intake or output data in the 24 hours ending 03/21/16 1417  PHYSICAL EXAMINATION:  Physical Exam  GENERAL:  82 y.o.-year-old patient lying in the bed with no acute distress.  LUNGS: Normal breath sounds bilaterally, no wheezing, rales,rhonchi or crepitation. No use of accessory muscles  of respiration.  CARDIOVASCULAR: S1, S2 normal. No murmurs, rubs, or gallops.  ABDOMEN: Soft, non-tender, non-distended. Bowel sounds present. No organomegaly or mass.  NEUROLOGIC: Moves all 4 extremities. PSYCHIATRIC: The patient is alert and oriented x 3.  SKIN: Right hip dressing  DATA REVIEW:   CBC No results for input(s): WBC, HGB, HCT, PLT in the last 168 hours.  Chemistries  No results for input(s): NA, K, CL, CO2, GLUCOSE, BUN, CREATININE, CALCIUM, MG, AST, ALT, ALKPHOS, BILITOT in the  last 168 hours.  Invalid input(s): GFRCGP  Cardiac Enzymes No results for input(s): TROPONINI in the last 168 hours.  Microbiology Results  Results for orders placed or performed during the hospital encounter of 03/08/16  MRSA PCR Screening     Status: None   Collection Time: 03/08/16 10:05 PM  Result Value Ref Range Status   MRSA by PCR NEGATIVE NEGATIVE Final    Comment:        The GeneXpert MRSA Assay (FDA approved for NASAL specimens only), is one component of a comprehensive MRSA colonization surveillance program. It is not intended to diagnose MRSA infection nor to guide or monitor treatment for MRSA infections.     RADIOLOGY:  No results found.  Follow up with PCP in 1 week.  Management plans discussed with the patient, family and they are in agreement.  CODE STATUS:  Code Status History    Date Active Date Inactive Code Status Order ID Comments User Context   03/09/2016 11:21 AM 03/12/2016  2:27 PM Full Code [id number]  Bernita RaisinKeivan Abtahi, DO Inpatient   03/08/2016  8:20 PM 03/09/2016 11:21 AM Full Code [id number]  Alford Highlandichard Wieting, MD ED      TOTAL TIME TAKING CARE OF THIS PATIENT ON DAY OF DISCHARGE: more than 30 minutes.   Milagros LollSudini, Willi Borowiak R M.D on 03/21/2016 at 2:17 PM  Between 7am to 6pm - Pager - [phone]  After 6pm go to [email or web address] - password [email or web address] [facility]  [facility] [place]  Office  [phone]  CC: Primary care physician; Gayla DossFitzgerald, David M, MD  Note: This dictation was prepared with Dragon dictation along with smaller phrase technology. Any transcriptional errors that result from this process are unintentional.

## 2016-04-25 ENCOUNTER — Ambulatory Visit: Payer: Medicare Other | Attending: Orthopedic Surgery | Admitting: Physical Therapy

## 2016-04-25 DIAGNOSIS — M25551 Pain in right hip: Secondary | ICD-10-CM

## 2016-04-25 DIAGNOSIS — R262 Difficulty in walking, not elsewhere classified: Secondary | ICD-10-CM | POA: Insufficient documentation

## 2016-04-25 NOTE — Patient Instructions (Signed)
Unable to perform SLS on RLE, can hold 3-5" on LLE   13.36 seconds 5x sit to stand   Tug with cane - 22"   TUG w/o cane - > 22" (clock malfunctioned)   Hip Abduction < 3/5  Gait observation - noted to have decreased stance time on RLE, lateral trunk flexion to R to compensate for gluteal weakness on RLE. (even with cane).

## 2016-04-28 NOTE — Therapy (Signed)
[facility] [address], [place], [address] Phone: [phone]   Fax:  [phone]  Physical Therapy Evaluation  Patient Details  Name: Lucas Melendez MRN: [id number] Date of Birth: Aug 07, 1930 No Data Recorded  Encounter Date: 04/25/2016      PT End of Session - 04/28/16 1648    Visit Number 1   Number of Visits 13   Date for PT Re-Evaluation 06/16/16   PT Start Time 1700   PT Stop Time 1800   PT Time Calculation (min) 60 min   Equipment Utilized During Treatment Gait belt   Activity Tolerance Patient tolerated treatment well   Behavior During Therapy WFL for tasks assessed/performed      Past Medical History:  Diagnosis Date  . Acid reflux   . Hypertension   . Thyroid disease    hyper    Past Surgical History:  Procedure Laterality Date  . BACK SURGERY    . CARPAL TUNNEL RELEASE    . CATARACT EXTRACTION    . COMPRESSION HIP SCREW Right 03/09/2016   Procedure: COMPRESSION HIP;  Surgeon: Bernita Raisin, DO;  Location: [facility] ORS;  Service: Orthopedics;  Laterality: Right;  . khyphoplasty      There were no vitals filed for this visit.       Subjective Assessment - 04/28/16 1649    Subjective Patient reports he was grabbing something out of a trash can at home, which flipped causing him to fall and sustain a R femur fx on 03/08/2016. He was discharged to home health PT, and has had several weeks of visits prior to this evaluation. He reports he was initially using a RW, but has now progressed to a SPC.    Limitations Lifting;Standing;Walking;House hold activities   Diagnostic tests X-ray confirming fracture, and repair site.    Patient Stated Goals To become more active and perform chores/gardening around the house    Currently in Pain? Yes  Patient reports pain increases with ambulation or prolonged weightbearing but is more tolerable than it was initially.      Gait analysis - patient  appears to perform a reverse Trendelenburg style of gait pattern (Torso laterally flexing over RLE in stance phase to compensate for hip abductor weakness). Decreased stance time on RLE, and decreased hip extension bilaterally.    Unable to perform SLS on RLE, can hold 3-5" on LLE   13.36 seconds 5x sit to stand   Tug with cane - 22"   TUG w/o cane - > 22" (clock malfunctioned)   Hip Abduction < 3/5  Gait observation - noted to have decreased stance time on RLE, lateral trunk flexion to R to compensate for gluteal weakness on RLE. (even with cane).   LEFS - 0/80  MMT - R quadricep - 4-4+/5, LLE WNL throughout.   Educated and observed on HEP Clamshells without band, progressed to band x12 per set (yellow band) -- cuing for proper set up  Sidelying hip abductions with BW (noted to require cuing for set up and positioning x 10 repetitions)   Supine bridging x 10 with bilateral LEs (could not adequately complete single leg bridging on this date)                          PT Education - 04/28/16 1656    Education provided Yes   Education Details HEP, progression of care and needs seen in evaluation.  Person(s) Educated Patient   Methods Explanation;Demonstration;Verbal cues;Handout   Comprehension Verbalized understanding;Returned demonstration;Verbal cues required             PT Long Term Goals - 04/28/16 1653      PT LONG TERM GOAL #1   Title Patient will complete 5x sit to stand in < 13 seconds without use of hands to demonstrate decreased falls risk.    Baseline 13.36 seconds with hands    Time 6   Period Weeks   Status New     PT LONG TERM GOAL #2   Title Patient will perform TUG in < 14" with LRAD to demonstrate decreased falls risk.    Baseline 22" with SPC    Time 6   Period Weeks   Status New     PT LONG TERM GOAL #3   Title Patient will perform single leg stance on RLE for > 3" to demonstrate decreased falls risk.    Baseline Unable  to complete.    Time 6   Period Weeks   Status New               Plan - 04/28/16 1656    Clinical Impression Statement Patient is roughly 6 weeks s/p R femur fx with subsequent repair. He has been participating in HHPT, however he continues to demonstrate altered gait pattern indicative of R hip musculature weakness. He continues to present as a high falls risk on multiple balance assessments and would likely benefit from skilled PT services to continue to address his decreased mobility.    Rehab Potential Good   PT Frequency 2x / week   PT Duration 6 weeks   PT Treatment/Interventions ADLs/Self Care Home Management;Moist Heat;Biofeedback;Therapeutic exercise;Therapeutic activities;Ultrasound;Balance training;Patient/family education;Neuromuscular re-education;Stair training;Gait training;Manual techniques   PT Next Visit Plan Progress posteior hip strengthening   PT Home Exercise Plan Clamshells, supine hip abductions with yellow t-band, single leg stance with assistance    Consulted and Agree with Plan of Care Patient      Patient will benefit from skilled therapeutic intervention in order to improve the following deficits and impairments:  Pain, Abnormal gait, Decreased activity tolerance, Decreased endurance, Decreased strength, Difficulty walking, Decreased balance  Visit Diagnosis: Difficulty in walking, not elsewhere classified - Plan: PT plan of care cert/re-cert  Pain in right hip - Plan: PT plan of care cert/re-cert      G-Codes - 04/28/16 1647    Functional Assessment Tool Used TUG, Patient Report, 5x sit to stand    Functional Limitation Mobility: Walking and moving around   Mobility: Walking and Moving Around Current Status [phone]([id number]) At least 20 percent but less than 40 percent impaired, limited or restricted   Mobility: Walking and Moving Around Goal Status [phone]([id number]) At least 1 percent but less than 20 percent impaired, limited or restricted       Problem  List Patient Active Problem List   Diagnosis Date Noted  . Hip fracture requiring operative repair, right, closed, initial encounter (HCC) 03/08/2016   Kerin RansomPatrick A Flor Whitacre, PT, DPT    04/28/2016, 4:57 PM  [facility] [address] [place], [place], [address] Phone: [phone]   Fax:  [phone]  Name: Lucas Melendez MRN: [id number] Date of Birth: [birth year]

## 2016-04-30 ENCOUNTER — Ambulatory Visit: Payer: Medicare Other | Admitting: Physical Therapy

## 2016-04-30 DIAGNOSIS — R262 Difficulty in walking, not elsewhere classified: Secondary | ICD-10-CM

## 2016-04-30 DIAGNOSIS — M25551 Pain in right hip: Secondary | ICD-10-CM

## 2016-04-30 NOTE — Therapy (Signed)
[facility] [address] [place], [place], [address] Phone: [phone]   Fax:  [phone]  Physical Therapy Treatment  Patient Details  Name: Lucas Melendez A Blacksher MRN: [id number] Date of Birth: 1930/11/08 No Data Recorded  Encounter Date: 04/30/2016      PT End of Session - 04/30/16 1312    Visit Number 2   Number of Visits 13   Date for PT Re-Evaluation 06/16/16   PT Start Time 1030   PT Stop Time 1115   PT Time Calculation (min) 45 min   Equipment Utilized During Treatment Gait belt   Activity Tolerance Patient tolerated treatment well   Behavior During Therapy [facility] for tasks assessed/performed      Past Medical History:  Diagnosis Date  . Acid reflux   . Hypertension   . Thyroid disease    hyper    Past Surgical History:  Procedure Laterality Date  . BACK SURGERY    . CARPAL TUNNEL RELEASE    . CATARACT EXTRACTION    . COMPRESSION HIP SCREW Right 03/09/2016   Procedure: COMPRESSION HIP;  Surgeon: Bernita RaisinKeivan Abtahi, DO;  Location: [facility] ORS;  Service: Orthopedics;  Laterality: Right;  . khyphoplasty      There were no vitals filed for this visit.      Subjective Assessment - 04/30/16 1310    Subjective Patient reports he was a little sore after the last session, has been diligent with HEP. He has been careful not to have any further falls.    Limitations Lifting;Standing;Walking;House hold activities   Diagnostic tests X-ray confirming fracture, and repair site.    Patient Stated Goals To become more active and perform chores/gardening around the house    Currently in Pain? Yes  R hip 4-5/10 laterally     Standing hip abductions with yellow t-band 3 sets x 12 repetitions bilaterally   Standing hip extensions with yellow t-band 3 sets x 12 repetitions bilaterally   Standing side stepping at TM 1x10 with yellow t-band (too easy) 2 sets x 10 repetitions with yellow and red t-bands (reports more  challenging). Cuing to increase stretch through the band  TRX sit to stands -- 3 sets x 12 repetitions (began to become challenging for him)   Step ups to 1 riser with cuing to stay taller throughout to allow for increased knee flexion-extension excursion 2 sets x 12 repetitions on RLE.                             PT Education - 04/30/16 1311    Education provided Yes   Education Details Rehabilitation for this injury is lengthy and extensive, priority is safety and not falling on R side again.    Person(s) Educated Patient   Methods Explanation;Demonstration   Comprehension Verbalized understanding;Returned demonstration             PT Long Term Goals - 04/28/16 1653      PT LONG TERM GOAL #1   Title Patient will complete 5x sit to stand in < 13 seconds without use of hands to demonstrate decreased falls risk.    Baseline 13.36 seconds with hands    Time 6   Period Weeks   Status New     PT LONG TERM GOAL #2   Title Patient will perform TUG in < 14" with LRAD to demonstrate decreased falls risk.    Baseline 22" with  SPC    Time 6   Period Weeks   Status New     PT LONG TERM GOAL #3   Title Patient will perform single leg stance on RLE for > 3" to demonstrate decreased falls risk.    Baseline Unable to complete.    Time 6   Period Weeks   Status New               Plan - 04/30/16 1312    Clinical Impression Statement Patient demonstrates significant improvement in tolerance for demanding hip abductor activities, though continues to demonstrate unbalanced antalgic gait pattern without use of SPC. He does still have significant weakness through RLE and will need extensive rehabilitation for normalization of gait pattern and safety with ambulation.    Rehab Potential Good   PT Frequency 2x / week   PT Duration 6 weeks   PT Treatment/Interventions ADLs/Self Care Home Management;Moist Heat;Biofeedback;Therapeutic exercise;Therapeutic  activities;Ultrasound;Balance training;Patient/family education;Neuromuscular re-education;Stair training;Gait training;Manual techniques   PT Next Visit Plan Progress posteior hip strengthening   PT Home Exercise Plan Clamshells, supine hip abductions with yellow t-band, single leg stance with assistance    Consulted and Agree with Plan of Care Patient      Patient will benefit from skilled therapeutic intervention in order to improve the following deficits and impairments:  Pain, Abnormal gait, Decreased activity tolerance, Decreased endurance, Decreased strength, Difficulty walking, Decreased balance  Visit Diagnosis: Difficulty in walking, not elsewhere classified  Pain in right hip     Problem List Patient Active Problem List   Diagnosis Date Noted  . Hip fracture requiring operative repair, right, closed, initial encounter (HCC) 03/08/2016   [name] PT, DPT, CSCS    04/30/2016, 1:15 PM  [place] [facility] PHYSICAL AND SPORTS MEDICINE [address], [place], [address] Phone: [phone]   Fax:  [phone]  Name: Lucas Melendez MRN: [id number] Date of Birth: 06-May-1930

## 2016-05-02 ENCOUNTER — Ambulatory Visit: Payer: Medicare Other | Admitting: Physical Therapy

## 2016-05-02 DIAGNOSIS — M25551 Pain in right hip: Secondary | ICD-10-CM

## 2016-05-02 DIAGNOSIS — R262 Difficulty in walking, not elsewhere classified: Secondary | ICD-10-CM | POA: Diagnosis not present

## 2016-05-02 NOTE — Therapy (Signed)
[facility] [address] [place], [place], [address] Phone: [phone]   Fax:  [phone]  Physical Therapy Treatment  Patient Details  Name: Lucas Melendez A Sangha MRN: [id number] Date of Birth: 1930-06-04 No Data Recorded  Encounter Date: 05/02/2016      PT End of Session - 05/02/16 1057    Visit Number 3   Number of Visits 13   Date for PT Re-Evaluation 06/16/16   PT Start Time 1035   PT Stop Time 1115   PT Time Calculation (min) 40 min   Equipment Utilized During Treatment Gait belt   Activity Tolerance Patient tolerated treatment well   Behavior During Therapy [facility] for tasks assessed/performed      Past Medical History:  Diagnosis Date  . Acid reflux   . Hypertension   . Thyroid disease    hyper    Past Surgical History:  Procedure Laterality Date  . BACK SURGERY    . CARPAL TUNNEL RELEASE    . CATARACT EXTRACTION    . COMPRESSION HIP SCREW Right 03/09/2016   Procedure: COMPRESSION HIP;  Surgeon: Bernita RaisinKeivan Abtahi, DO;  Location: [facility] ORS;  Service: Orthopedics;  Laterality: Right;  . khyphoplasty      There were no vitals filed for this visit.      Subjective Assessment - 05/02/16 1040    Subjective Patient reports soreness in his lateral hip after last session, no new falls. He has been completing his HEP as written.    Limitations Lifting;Standing;Walking;House hold activities   Diagnostic tests X-ray confirming fracture, and repair site.    Patient Stated Goals To become more active and perform chores/gardening around the house    Currently in Pain? Yes  Reports his R lateral hip is a little more sore than usual      Mini squats at treadmill 3 sets x 10 repetitions (not able to get to parallel, but is able to achieve 70-80 degrees of knee flexion)   Standing hip abductions on blue foam pad with yellow t-band 3x 10 with hand/finger support.   Step Ups x10 bilaterally with HHA on BOSU on L,  attempted on R side, poor mechanics through first 6, opted for just a platform for 3 sets x 10 repetitions   L hip abductions on Matrix 4 x5 with 3" holds (very fatiguing) at 25#  Gait analysis with and without cane -- continues to have lateral trunk flexion without cane in R sided weightbearing, which is diminished with cane.                            PT Education - 05/02/16 1057    Education provided Yes   Education Details He is progressing well, but this is a difficult injury  to rehab.    Person(s) Educated Patient   Methods Explanation;Demonstration   Comprehension Verbalized understanding;Returned demonstration             PT Long Term Goals - 04/28/16 1653      PT LONG TERM GOAL #1   Title Patient will complete 5x sit to stand in < 13 seconds without use of hands to demonstrate decreased falls risk.    Baseline 13.36 seconds with hands    Time 6   Period Weeks   Status New     PT LONG TERM GOAL #2   Title Patient will perform TUG in < 14" with LRAD to  demonstrate decreased falls risk.    Baseline 22" with SPC    Time 6   Period Weeks   Status New     PT LONG TERM GOAL #3   Title Patient will perform single leg stance on RLE for > 3" to demonstrate decreased falls risk.    Baseline Unable to complete.    Time 6   Period Weeks   Status New               Plan - 05/02/16 1057    Clinical Impression Statement Patient is progressing well with strengthening, though he is moderately limited in this session as he has some residual soreness from previous session. He continues to demonstrate lateral trunk flexion in unsupported gait, however this appears to be improving from last week.    Rehab Potential Good   PT Frequency 2x / week   PT Duration 6 weeks   PT Treatment/Interventions ADLs/Self Care Home Management;Moist Heat;Biofeedback;Therapeutic exercise;Therapeutic activities;Ultrasound;Balance training;Patient/family  education;Neuromuscular re-education;Stair training;Gait training;Manual techniques   PT Next Visit Plan Progress posteior hip strengthening   PT Home Exercise Plan Clamshells, supine hip abductions with yellow t-band, single leg stance with assistance    Consulted and Agree with Plan of Care Patient      Patient will benefit from skilled therapeutic intervention in order to improve the following deficits and impairments:  Pain, Abnormal gait, Decreased activity tolerance, Decreased endurance, Decreased strength, Difficulty walking, Decreased balance  Visit Diagnosis: Difficulty in walking, not elsewhere classified  Pain in right hip     Problem List Patient Active Problem List   Diagnosis Date Noted  . Hip fracture requiring operative repair, right, closed, initial encounter (HCC) 03/08/2016   [name] PT, DPT, CSCS    05/02/2016, 3:18 PM  [place] [facility] PHYSICAL AND SPORTS MEDICINE [address], [place], [address] Phone: [phone]   Fax:  [phone]  Name: Lucas Melendez MRN: [id number] Date of Birth: Oct 20, 1930

## 2016-05-02 NOTE — Patient Instructions (Addendum)
Mini squats at treadmill 3 sets x 10 repetitions (not able to get to parallel, but is able to achieve 70-80 degrees of knee flexion)   Standing hip abductions on blue foam pad with yellow t-band 3x 10 with hand/finger support.   Step Ups x10 bilaterally with HHA on BOSU on L, attempted on R side, poor mechanics through first 6, opted for just a platform  L hip abductions on Matrix 4 x5 with 3" holds (very fatiguing)   Gait analysis with and without cane

## 2016-05-06 ENCOUNTER — Ambulatory Visit: Payer: Medicare Other | Admitting: Physical Therapy

## 2016-05-06 DIAGNOSIS — R262 Difficulty in walking, not elsewhere classified: Secondary | ICD-10-CM | POA: Diagnosis not present

## 2016-05-06 DIAGNOSIS — M25551 Pain in right hip: Secondary | ICD-10-CM

## 2016-05-06 NOTE — Patient Instructions (Addendum)
TG single leg squats at level 18 - 15 repetitions on RLE (not painful but not challenging) -- level 23 x12, at level 25 - x 11 for 2 sets   Supine isometric clamshells with 5" holds x 8 repetitions for 3 sets (patient reporting decreased levels of pain)  Step Ups x 12 on RLE with 1 step underneath plastic riser and HHA bilaterally (challenging)

## 2016-05-06 NOTE — Therapy (Signed)
[facility] [address] [place], [place], [address] Phone: [phone]   Fax:  [phone]  Physical Therapy Treatment  Patient Details  Name: Lucas Melendez A Weismann MRN: [id number] Date of Birth: May 25, 1930 No Data Recorded  Encounter Date: 05/06/2016      PT End of Session - 05/06/16 1101    Visit Number 4   Number of Visits 13   Date for PT Re-Evaluation 06/16/16   PT Start Time 1030   PT Stop Time 1115   PT Time Calculation (min) 45 min   Equipment Utilized During Treatment --   Activity Tolerance Patient tolerated treatment well;Patient limited by pain   Behavior During Therapy [facility] for tasks assessed/performed      Past Medical History:  Diagnosis Date  . Acid reflux   . Hypertension   . Thyroid disease    hyper    Past Surgical History:  Procedure Laterality Date  . BACK SURGERY    . CARPAL TUNNEL RELEASE    . CATARACT EXTRACTION    . COMPRESSION HIP SCREW Right 03/09/2016   Procedure: COMPRESSION HIP;  Surgeon: Bernita RaisinKeivan Abtahi, DO;  Location: [facility] ORS;  Service: Orthopedics;  Laterality: Right;  . khyphoplasty      There were no vitals filed for this visit.      Subjective Assessment - 05/06/16 1032    Subjective Patient states he is sore today, he reports he was quite active over the weekend and has had good days and bad days. Reports he has been walking quite a bit without the cane, and has even forget he needed it at times.    Limitations Lifting;Standing;Walking;House hold activities   Diagnostic tests X-ray confirming fracture, and repair site.    Patient Stated Goals To become more active and perform chores/gardening around the house    Currently in Pain? Yes   Pain Score 4    Pain Location Hip   Pain Orientation Right;Lateral   Pain Descriptors / Indicators Aching   Pain Type Chronic pain;Surgical pain   Pain Onset More than a month ago   Pain Frequency Intermittent      TG single leg  squats at level 18 - 15 repetitions on RLE (not painful but not challenging) -- level 23 x12, at level 25 - x 11 for 2 sets   Supine isometric clamshells with 5" holds x 8 repetitions for 3 sets (patient reporting decreased levels of pain)  Step Ups x 12 on RLE with 1 step underneath plastic riser and HHA bilaterally (challenging)  Sidelying STM to R lateral hip area around greater trochanter and inferior and lateral gluteal complex x 10 minutes (patient reported decreased level of pain after completion)                             PT Education - 05/06/16 1100    Education provided Yes   Education Details Will continue to progress as tolerated with rehab secondary to complaints of pain.    Person(s) Educated Patient   Methods Explanation;Demonstration   Comprehension Verbalized understanding;Returned demonstration             PT Long Term Goals - 04/28/16 1653      PT LONG TERM GOAL #1   Title Patient will complete 5x sit to stand in < 13 seconds without use of hands to demonstrate decreased falls risk.    Baseline 13.36 seconds with  hands    Time 6   Period Weeks   Status New     PT LONG TERM GOAL #2   Title Patient will perform TUG in < 14" with LRAD to demonstrate decreased falls risk.    Baseline 22" with SPC    Time 6   Period Weeks   Status New     PT LONG TERM GOAL #3   Title Patient will perform single leg stance on RLE for > 3" to demonstrate decreased falls risk.    Baseline Unable to complete.    Time 6   Period Weeks   Status New               Plan - 05/06/16 1101    Clinical Impression Statement Patient reporting today is not a good day for him pain wise, thus therapy in this session was modified to not exacerbate his symptoms. He demonstrates increased instability on RLE during ambulation without cane in this session, likely from pain inhibition. He does report he is noticeably improving gait on days when he is not  experiencing as much pain.    Rehab Potential Good   PT Frequency 2x / week   PT Duration 6 weeks   PT Treatment/Interventions ADLs/Self Care Home Management;Moist Heat;Biofeedback;Therapeutic exercise;Therapeutic activities;Ultrasound;Balance training;Patient/family education;Neuromuscular re-education;Stair training;Gait training;Manual techniques   PT Next Visit Plan Progress posteior hip strengthening   PT Home Exercise Plan Mini squats, lateral band walking, step ups, standing hip abductions.    Consulted and Agree with Plan of Care Patient      Patient will benefit from skilled therapeutic intervention in order to improve the following deficits and impairments:  Pain, Abnormal gait, Decreased activity tolerance, Decreased endurance, Decreased strength, Difficulty walking, Decreased balance  Visit Diagnosis: Difficulty in walking, not elsewhere classified  Pain in right hip     Problem List Patient Active Problem List   Diagnosis Date Noted  . Hip fracture requiring operative repair, right, closed, initial encounter (HCC) 03/08/2016    Lucas Melendez 05/06/2016, 1:00 PM  [place] [facility] PH[facility]ORTS MEDICINE [address] [place], [address] Phone: [phone]   Fax:  [phone]  Name: Lucas Melendez MRN: [id number] Date of Birth: August 27, 1930

## 2016-05-09 ENCOUNTER — Ambulatory Visit: Payer: Medicare Other | Admitting: Physical Therapy

## 2016-05-09 DIAGNOSIS — M25551 Pain in right hip: Secondary | ICD-10-CM | POA: Diagnosis not present

## 2016-05-09 DIAGNOSIS — R262 Difficulty in walking, not elsewhere classified: Secondary | ICD-10-CM

## 2016-05-09 NOTE — Therapy (Signed)
[facility] [address] [place], [place], [address] Phone: [phone]   Fax:  [phone]  Physical Therapy Treatment  Patient Details  Name: Lucas Melendez MRN: [id number] Date of Birth: 1930/10/17 No Data Recorded  Encounter Date: 05/09/2016      PT End of Session - 05/09/16 1518    Visit Number 5   Number of Visits 13   Date for PT Re-Evaluation 06/16/16   PT Start Time 1115   PT Stop Time 1201   PT Time Calculation (min) 46 min   Activity Tolerance Patient tolerated treatment well;Patient limited by pain   Behavior During Therapy [facility] for tasks assessed/performed      Past Medical History:  Diagnosis Date  . Acid reflux   . Hypertension   . Thyroid disease    hyper    Past Surgical History:  Procedure Laterality Date  . BACK SURGERY    . CARPAL TUNNEL RELEASE    . CATARACT EXTRACTION    . COMPRESSION HIP SCREW Right 03/09/2016   Procedure: COMPRESSION HIP;  Surgeon: Bernita RaisinKeivan Abtahi, DO;  Location: [facility] ORS;  Service: Orthopedics;  Laterality: Right;  . khyphoplasty      There were no vitals filed for this visit.      Subjective Assessment - 05/09/16 1513    Subjective Patient reports he has had some soreness in anterior thigh, reports he was able to walk some yesterday without his cane and it went fairly well. Reports his wife  has noticed an improvement in his gait quality over the last 1-2 weeks.    Limitations Lifting;Standing;Walking;House hold activities   Diagnostic tests X-ray confirming fracture, and repair site.    Patient Stated Goals To become more active and perform chores/gardening around the house    Currently in Pain? Yes  Soreness in anterior thigh.       TherEx   Step ups 10 x 2 sets on platform with 2 steps. Regressed to 1 step on the R side; L side with 2 steps (cuing to maintain more upright posture due to observed compensations through trunk).   Side stepping 10 x 2  sets with green band (progressed from red band)  SLS with toe tapping from airex 3 ways. 8 x 2 sets each side.(most challenging with R stance having L foot catch on air-x pad on return to neutral)   Sit<>stand with TRX 3 sets x 15 repetitions (patient unable to stand without UEs, however able to eccentrically control transfer without hands with cuing   Leg press 8 x 3 sets with 45, 65,  85 lbs.(85# most appropriate and challenging for patient)                            PT Education - 05/09/16 1515    Education provided Yes   Education Details Will continue to progress his HEP (added green t-band on this date).    Person(s) Educated Patient   Methods Explanation;Demonstration   Comprehension Returned demonstration;Verbalized understanding             PT Long Term Goals - 04/28/16 1653      PT LONG TERM GOAL #1   Title Patient will complete 5x sit to stand in < 13 seconds without use of hands to demonstrate decreased falls risk.    Baseline 13.36 seconds with hands    Time 6   Period Weeks  Status New     PT LONG TERM GOAL #2   Title Patient will perform TUG in < 14" with LRAD to demonstrate decreased falls risk.    Baseline 22" with SPC    Time 6   Period Weeks   Status New     PT LONG TERM GOAL #3   Title Patient will perform single leg stance on RLE for > 3" to demonstrate decreased falls risk.    Baseline Unable to complete.    Time 6   Period Weeks   Status New               Plan - 05/09/16 1518    Clinical Impression Statement Patient reports tightness and soreness quadriceps from previous session. We assisted with stretching the R quadricep and relieved his tightness enough to continue with treatment. Pt demonstrated some increased strength, however he had issues with balance exercises which need mod A. He demonstrates increased difficulty with SLS compliant surfaces due to weak hip abductors, flexors, and external rotators.   Step-up exercises had to be regressed due to being too difficult for the patient on the R side.   Rehab Potential Good   PT Frequency 2x / week   PT Duration 6 weeks   PT Treatment/Interventions ADLs/Self Care Home Management;Moist Heat;Biofeedback;Therapeutic exercise;Therapeutic activities;Ultrasound;Balance training;Patient/family education;Neuromuscular re-education;Stair training;Gait training;Manual techniques   PT Next Visit Plan Progress posteior hip strengthening   PT Home Exercise Plan Mini squats, lateral band walking, step ups, standing hip abductions.    Consulted and Agree with Plan of Care Patient      Patient will benefit from skilled therapeutic intervention in order to improve the following deficits and impairments:  Pain, Abnormal gait, Decreased activity tolerance, Decreased endurance, Decreased strength, Difficulty walking, Decreased balance  Visit Diagnosis: Difficulty in walking, not elsewhere classified  Pain in right hip     Problem List Patient Active Problem List   Diagnosis Date Noted  . Hip fracture requiring operative repair, right, closed, initial encounter (HCC) 03/08/2016   [name] PT, DPT, CSCS    05/09/2016, 3:51 PM  [place] [facility] [address] [place], [address] Phone: [phone]   Fax:  [phone]  Name: Lucas Melendez MRN: [id number] Date of Birth: 1931-02-20

## 2016-05-13 ENCOUNTER — Ambulatory Visit: Payer: Medicare Other | Admitting: Physical Therapy

## 2016-05-13 DIAGNOSIS — M25551 Pain in right hip: Secondary | ICD-10-CM

## 2016-05-13 DIAGNOSIS — R262 Difficulty in walking, not elsewhere classified: Secondary | ICD-10-CM | POA: Diagnosis not present

## 2016-05-13 NOTE — Therapy (Signed)
[place] [facility] [address] , [place], [address] Phone: [phone]   Fax:  [phone]  Physical Therapy Treatment  Patient Details  Name: Lucas Melendez MRN: [id number] Date of Birth: 1930/10/19 No Data Recorded  Encounter Date: 05/13/2016      PT End of Session - 05/13/16 1058    Visit Number 6   Number of Visits 13   Date for PT Re-Evaluation 06/16/16   PT Start Time 1022   PT Stop Time 1100   PT Time Calculation (min) 38 min   Activity Tolerance Patient tolerated treatment well   Behavior During Therapy [facility] for tasks assessed/performed      Past Medical History:  Diagnosis Date  . Acid reflux   . Hypertension   . Thyroid disease    hyper    Past Surgical History:  Procedure Laterality Date  . BACK SURGERY    . CARPAL TUNNEL RELEASE    . CATARACT EXTRACTION    . COMPRESSION HIP SCREW Right 03/09/2016   Procedure: COMPRESSION HIP;  Surgeon: Bernita RaisinKeivan Abtahi, DO;  Location: [facility] ORS;  Service: Orthopedics;  Laterality: Right;  . khyphoplasty      There were no vitals filed for this visit.      Subjective Assessment - 05/13/16 1028    Subjective Patient reports [facility] and walk around. Reports some soreness after previous session, but denies any pain today.    Limitations Lifting;Standing;Walking;House hold activities   Diagnostic tests X-ray confirming fracture, and repair site.    Patient Stated Goals To become more active and perform chores/gardening around the house    Currently in Pain? No/denies      TUG - 15.19, 15.26 seconds with use of hands   Leg Press- 85# x 8, 95# x 8, 105#  X 12 repetitions.   Lateral band walks with blue t-band around ankles x 10 for 3 sets   Matrix hip x 8 for 3 sets (progressed from 40 to 55# on 2nd and 3rd sets)   Standing hip abductions with blue t-band x 10 for 3 sets (cuing to have less lateral  trunk flexion) -- able to progress with more hip abduction/less trunk lateral flexion.   TG single leg squats at lvl 18 x 12 (not as challenging as it needed to be on LLE) -- progressed to level 21 on majority RLE (both in contact) x 10 , x 12 (last set x 12)   Partial range lunges with bilateral HHA x 12 for 3 sets                             PT Education - 05/13/16 1057    Education provided Yes   Education Details PT observing less and less lateral trunk flexion in unassisted walking.    Person(s) Educated Patient   Methods Demonstration;Explanation;Handout   Comprehension Verbalized understanding;Returned demonstration             PT Long Term Goals - 04/28/16 1653      PT LONG TERM GOAL #1   Title Patient will complete 5x sit to stand in < 13 seconds without use of hands to demonstrate decreased falls risk.    Baseline 13.36 seconds with hands    Time 6   Period Weeks   Status New     PT LONG TERM  GOAL #2   Title Patient will perform TUG in < 14" with LRAD to demonstrate decreased falls risk.    Baseline 22" with SPC    Time 6   Period Weeks   Status New     PT LONG TERM GOAL #3   Title Patient will perform single leg stance on RLE for > 3" to demonstrate decreased falls risk.    Baseline Unable to complete.    Time 6   Period Weeks   Status New               Plan - 05/13/16 1058    Clinical Impression Statement Patient demonstrating reduced lateral trunk flexion during ambulation without assistive device. He continues to have R hip/thigh weakness relative to his LLE, however he is progressing quite well with observable changes in gait mechanics without assistive device (for brief bouts).    Rehab Potential Good   PT Frequency 2x / week   PT Duration 6 weeks   PT Treatment/Interventions ADLs/Self Care Home Management;Moist Heat;Biofeedback;Therapeutic exercise;Therapeutic activities;Ultrasound;Balance training;Patient/family  education;Neuromuscular re-education;Stair training;Gait training;Manual techniques   PT Next Visit Plan Progress posteior hip strengthening   PT Home Exercise Plan Mini squats, lateral band walking, step ups, standing hip abductions.    Consulted and Agree with Plan of Care Patient      Patient will benefit from skilled therapeutic intervention in order to improve the following deficits and impairments:  Pain, Abnormal gait, Decreased activity tolerance, Decreased endurance, Decreased strength, Difficulty walking, Decreased balance  Visit Diagnosis: Difficulty in walking, not elsewhere classified  Pain in right hip     Problem List Patient Active Problem List   Diagnosis Date Noted  . Hip fracture requiring operative repair, right, closed, initial encounter (HCC) 03/08/2016    [name] PT, DPT, CSCS    05/13/2016, 12:02 PM  [place] [facility] [address] [place], [address] Phone: [phone]   Fax:  [phone]  Name: Lucas Melendez MRN: [id number] Date of Birth: 1931/03/07

## 2016-05-13 NOTE — Patient Instructions (Addendum)
TUG - 15.19, 15.26 seconds with use of hands   Leg Press- 85# x 8, 95# x 8, 105#    Lateral band walks with blue t-band around ankles x 10 for 3 sets   Matrix hip x 8 for 3 sets (progressed from 40 to 55# on 2nd and 3rd sets)   Standing hip abductions with blue t-band x 10 for 3 sets (cuing to have less lateral trunk flexion) -- able to progress with more hip abduction/less trunk lateral flexion.   TG single leg squats at lvl 18 x 12 (not as challenging as it needed to be on LLE) -- progressed to level 21 on majority RLE (both in contact) x 10 , x 12 (last set x 12)   Partial range lunges with bilateral HHA x 12 for 3 sets

## 2016-05-16 ENCOUNTER — Ambulatory Visit: Payer: Medicare Other | Admitting: Physical Therapy

## 2016-05-16 DIAGNOSIS — M25551 Pain in right hip: Secondary | ICD-10-CM

## 2016-05-16 DIAGNOSIS — R262 Difficulty in walking, not elsewhere classified: Secondary | ICD-10-CM

## 2016-05-16 NOTE — Patient Instructions (Addendum)
Lateral step ups to step with 2 risers   Forward step ups to step with 2 risers   Standing hip abductions on BOSU with 2 finger support   Leg Press at 115# x 9, 125# x 9, 145# x8 repetitions (more appropriate)   TRX squats to chair (just touching the chair, not sitting in it) x 12, x 12 (with cuing to decrease use of UEs) x 12  Matrix hip machine @ 70# x 10 repetitions for 3 sets   TUG without cane - 13.24 seconds, with cane 13.46 seconds.

## 2016-05-16 NOTE — Therapy (Signed)
[place] [address] [place], [place], [address] Phone: [phone]   Fax:  [phone]  Physical Therapy Treatment  Patient Details  Name: Lucas Melendez MRN: [id number] Date of Birth: 06-10-1930 No Data Recorded  Encounter Date: 05/16/2016      PT End of Session - 05/16/16 1131    Visit Number 7   Number of Visits 13   Date for PT Re-Evaluation 06/16/16   PT Start Time 1115   PT Stop Time 1200   PT Time Calculation (min) 45 min   Activity Tolerance Patient tolerated treatment well   Behavior [facility] Therapy [facility] for tasks assessed/performed      Past Medical History:  Diagnosis Date  . Acid reflux   . Hypertension   . Thyroid disease    hyper    Past Surgical History:  Procedure Laterality Date  . BACK SURGERY    . CARPAL TUNNEL RELEASE    . CATARACT EXTRACTION    . COMPRESSION HIP SCREW Right 03/09/2016   Procedure: COMPRESSION HIP;  Surgeon: Bernita RaisinKeivan Abtahi, DO;  Location: [facility] OR[facility];  Service: Orthopedics;  Laterality: Right;  . khyphoplasty      There were no vitals filed for this visit.      Subjective Assessment - 05/16/16 1127    Subjective Patient reports some chronic soreness in his R hip, but nothing intense or severe.    Limitations Lifting;Standing;Walking;House hold activities   Diagnostic tests X-ray confirming fracture, and repair site.    Patient Stated Goals To become more active and perform chores/gardening around the house    Currently in Pain? Yes  Chronic mild soreness in lateral and posterior R hip        Lateral step ups to step with 2 risers -- 2 sets x 10 with 2 finger HHA bilaterally   Forward step ups to step with 2 risers --  2 sets x 10 with 2 finger HHA bilaterally  Standing hip abductions on BOSU with 2 finger support -- 2 sets x 10 bilaterally   Leg Press at 115# x 9, 125# x 9, 145# x8 repetitions (more appropriate)   TRX squats to chair (just touching  the chair, not sitting in it) x 12, x 12 (with cuing to decrease use of UEs) x 12  Matrix hip machine @ 70# x 10 repetitions for 3 sets   TUG without cane - 13.24 seconds, with cane 13.46 seconds.                          PT Education - 05/16/16 1131    Education provided Yes   Education Details Will likely require PT through March given his progress.    Person(s) Educated Patient   Methods Explanation;Demonstration   Comprehension Verbalized understanding;Returned demonstration             PT Long Term Goals - 05/16/16 1227      PT LONG TERM GOAL #1   Title Patient will complete 5x sit to stand in < 13 seconds without use of hands to demonstrate decreased falls risk.    Baseline 13.36 seconds with hands    Time 6   Period Weeks   Status On-going     PT LONG TERM GOAL #2   Title Patient will perform TUG in < 14" with LRAD to demonstrate decreased falls risk.    Baseline 22" with SPC    Time  6   Period Weeks   Status Achieved     PT LONG TERM GOAL #3   Title Patient will perform single leg stance on RLE for > 3" to demonstrate decreased falls risk.    Baseline Unable to complete.    Time 6   Period Weeks   Status On-going               Plan - 05/16/16 1132    Clinical Impression Statement Patient has made excellent progress with gait speed, turning mechanics, and symmetry in weightbearing. He has made excellent progress with TUG (from 22" at baseline to over 13" today). He continues to have relative weakness in R hip musculature, though able to tolerate progressions with all ther-ex.    Rehab Potential Good   PT Frequency 2x / week   PT Duration 6 weeks   PT Treatment/Interventions ADLs/Self Care Home Management;Moist Heat;Biofeedback;Therapeutic exercise;Therapeutic activities;Ultrasound;Balance training;Patient/family education;Neuromuscular re-education;Stair training;Gait training;Manual techniques   PT Next Visit Plan Progress  posteior hip strengthening   PT Home Exercise Plan Mini squats, lateral band walking, step ups, standing hip abductions.    Consulted and Agree with Plan of Care Patient      Patient will benefit from skilled therapeutic intervention in order to improve the following deficits and impairments:  Pain, Abnormal gait, Decreased activity tolerance, Decreased endurance, Decreased strength, Difficulty walking, Decreased balance  Visit Diagnosis: Difficulty in walking, not elsewhere classified  Pain in right hip     Problem List Patient Active Problem List   Diagnosis Date Noted  . Hip fracture requiring operative repair, right, closed, initial encounter (HCC) 03/08/2016   [name] PT, DPT, CSCS    05/16/2016, 12:28 PM  [place] [facility]IC[facility] [address] [place], [address] Phone: [phone]   Fax:  [phone]  Name: Lucas Melendez MRN: [id number] Date of Birth: 26-Apr-1930

## 2016-05-21 ENCOUNTER — Ambulatory Visit: Payer: Medicare Other | Admitting: Physical Therapy

## 2016-05-21 DIAGNOSIS — M25551 Pain in right hip: Secondary | ICD-10-CM

## 2016-05-21 DIAGNOSIS — R262 Difficulty in walking, not elsewhere classified: Secondary | ICD-10-CM | POA: Diagnosis not present

## 2016-05-21 NOTE — Patient Instructions (Addendum)
Leg Press - 135x6, 155x6 for 3 sets with bilateral LEs (8 on 4th set)   TRX  Sit to stands 3 x15 (still unable to stand out of chair without use of UEs).   Sit to stands with hands on TM x 8 for 3 sets (starting to feel fatigue)   5x sit to stand with hands - 12.18 seconds   MATRIX hip abduction machine - 55# x 8 bilaterally, progressed to 70# x  7 for 2 sets

## 2016-05-21 NOTE — Therapy (Signed)
[place] [facility] [address] [place], [place], [address] Phone: [phone]   Fax:  [phone]  Physical Therapy Treatment  Patient Details  Name: Lucas Melendez MRN: [id number] Date of Birth: 02-15-1931 No Data Recorded  Encounter Date: 05/21/2016      PT End of Session - 05/21/16 1136    Visit Number 8   Number of Visits 13   Date for PT Re-Evaluation 06/16/16   PT Start Time 1117   PT Stop Time 1156   PT Time Calculation (min) 39 min   Activity Tolerance Patient tolerated treatment well   Behavior During Therapy [facility] for tasks assessed/performed      Past Medical History:  Diagnosis Date  . Acid reflux   . Hypertension   . Thyroid disease    hyper    Past Surgical History:  Procedure Laterality Date  . BACK SURGERY    . CARPAL TUNNEL RELEASE    . CATARACT EXTRACTION    . COMPRESSION HIP SCREW Right 03/09/2016   Procedure: COMPRESSION HIP;  Surgeon: Bernita RaisinKeivan Abtahi, DO;  Location: [facility];  Service: Orthopedics;  Laterality: Right;  . khyphoplasty      There were no vitals filed for this visit.      Subjective Assessment - 05/21/16 1121    Subjective Patient states he was able to go to the grocery store over the weekend, he has been walking around the house without his cane. He does still have some soreness in the lateral aspect of his R hip.    Limitations Lifting;Standing;Walking;House hold activities   Diagnostic tests X-ray confirming fracture, and repair site.    Patient Stated Goals To become more active and perform chores/gardening around the house    Currently in Pain? Yes  Reports some soreness in lateral aspect of R hip.       Leg Press - 135x6, 155x6 for 3 sets with bilateral LEs (8 on 4th set)   TRX  Sit to stands 3 x15 (still unable to stand out of chair without use of UEs).   Sit to stands with hands on TM x 8 for 3 sets (starting to feel fatigue)   5x sit to stand with hands -  12.18 seconds   MATRIX hip abduction machine - 55# x 8 bilaterally, progressed to 70# x  7 for 2 sets  Multiple bouts of cuing/observing patient acsend/descend steps. Educated patient to use railing as little as possible in clinic and ascend/descend reciprocally which he was able to do with min A from LUE on 4th bout of practice   Supine piriformis stretching x 4 minutes (reported a sensation of pain relief).                             PT Education - 05/21/16 1135    Education provided Yes   Education Details Will continue to progress towards no AD for ambulation. Pain is normal still given the healing time frame.   Person(s) Educated Patient   Methods Explanation;Demonstration   Comprehension Returned demonstration;Verbalized understanding             PT Long Term Goals - 05/21/16 1233      PT LONG TERM GOAL #1   Title Patient will complete 5x sit to stand in < 13 seconds without use of hands to demonstrate decreased falls risk.    Baseline 13.36 seconds with hands -- on  2/27 - 12.18 seconds.    Time 6   Period Weeks   Status Achieved     PT LONG TERM GOAL #2   Title Patient will perform TUG in < 14" with LRAD to demonstrate decreased falls risk.    Baseline 22" with SPC    Time 6   Period Weeks   Status Achieved     PT LONG TERM GOAL #3   Title Patient will perform single leg stance on RLE for > 3" to demonstrate decreased falls risk.    Baseline Unable to complete.    Time 6   Period Weeks   Status On-going               Plan - 05/21/16 1136    Clinical Impression Statement Patient continues to make improvements with strengthening activities (weight, reps performed) which has carried over nicely into gait. Initially he is antalgic with ambulaiton, with prolonged weightbearing activities and time ambulaitng, he is demonstrating decreased trunk compensations.    Rehab Potential Good   PT Frequency 2x / week   PT Duration 6 weeks    PT Treatment/Interventions ADLs/Self Care Home Management;Moist Heat;Biofeedback;Therapeutic exercise;Therapeutic activities;Ultrasound;Balance training;Patient/family education;Neuromuscular re-education;Stair training;Gait training;Manual techniques   PT Next Visit Plan Progress posteior hip strengthening   PT Home Exercise Plan Mini squats, lateral band walking, step ups, standing hip abductions.    Consulted and Agree with Plan of Care Patient      Patient will benefit from skilled therapeutic intervention in order to improve the following deficits and impairments:  Pain, Abnormal gait, Decreased activity tolerance, Decreased endurance, Decreased strength, Difficulty walking, Decreased balance  Visit Diagnosis: Difficulty in walking, not elsewhere classified  Pain in right hip     Problem List Patient Active Problem List   Diagnosis Date Noted  . Hip fracture requiring operative repair, right, closed, initial encounter (HCC) 03/08/2016   [name] PT, DPT, CSCS    05/21/2016, 12:34 PM  [place] [facility] [address], [place], [address] Phone: [phone]   Fax:  [phone]  Name: Lucas Melendez MRN: [id number] Date of Birth: [birth year]

## 2016-05-23 ENCOUNTER — Ambulatory Visit: Payer: Medicare Other | Attending: Orthopedic Surgery | Admitting: Physical Therapy

## 2016-05-23 DIAGNOSIS — M25551 Pain in right hip: Secondary | ICD-10-CM | POA: Diagnosis present

## 2016-05-23 DIAGNOSIS — R262 Difficulty in walking, not elsewhere classified: Secondary | ICD-10-CM | POA: Diagnosis present

## 2016-05-27 ENCOUNTER — Ambulatory Visit: Payer: Medicare Other | Admitting: Physical Therapy

## 2016-05-27 DIAGNOSIS — R262 Difficulty in walking, not elsewhere classified: Secondary | ICD-10-CM

## 2016-05-27 DIAGNOSIS — M25551 Pain in right hip: Secondary | ICD-10-CM

## 2016-05-27 NOTE — Therapy (Signed)
[place] [facility] [address] [place], [place], [address] Phone: [phone]   Fax:  [phone]  Physical Therapy Treatment  Patient Details  Name: Lucas Melendez MRN: [id number] Date of Birth: 01-04-1931 No Data Recorded  Encounter Date: 05/23/2016      PT End of Session - 05/27/16 1248    Visit Number 9   Number of Visits 13   Date for PT Re-Evaluation 06/16/16   PT Start Time 1110   PT Stop Time 1155   PT Time Calculation (min) 45 min   Activity Tolerance Patient tolerated treatment well   Behavior During Therapy [facility] for tasks assessed/performed      Past Medical History:  Diagnosis Date  . Acid reflux   . Hypertension   . Thyroid disease    hyper    Past Surgical History:  Procedure Laterality Date  . BACK SURGERY    . CARPAL TUNNEL RELEASE    . CATARACT EXTRACTION    . COMPRESSION HIP SCREW Right 03/09/2016   Procedure: COMPRESSION HIP;  Surgeon: Bernita RaisinKeivan Abtahi, DO;  Location: [facility] ORS;  Service: Orthopedics;  Laterality: Right;  . khyphoplasty      There were no vitals filed for this visit.      Subjective Assessment - 05/27/16 1247    Subjective Patient reports he has been having soreness in the R side of his hip.    Limitations Lifting;Standing;Walking;House hold activities   Diagnostic tests X-ray confirming fracture, and repair site.    Patient Stated Goals To become more active and perform chores/gardening around the house    Currently in Pain? Yes  Soreness in his R lateral hip      Performed FGA  1. 3 2. 2 3. 2 4. 2 5. 3 6. 3 7.3 8. 2 9. 2 10. 2  24/30 indicative of moderate falls risk, but not in high falls risk   Side kicks with green t-band 2x12 with red and green t-band (bilaterally)   Hip abduction machine setting 9 for 70# - 2 sets x 12 repetitions bilaterally   Hip IR mobilizations with belt 3 bouts x 30-60" oscillations at end range for pain control. Added in  manual hip ER/piriformis stretching in supine 3 bouts x 30-60" (patient reported decreased soreness in R hip area after completion.   Gait observation - noted to continue to flex trunk laterally, however significantly reduced from baseline/intake.                            PT Education - 05/27/16 1248    Education provided Yes   Education Details Patient looks appropriate for not using SPC for ambulation for short distances given his FGA score.    Person(s) Educated Patient   Methods Explanation;Demonstration   Comprehension Verbalized understanding;Returned demonstration             PT Long Term Goals - 05/21/16 1233      PT LONG TERM GOAL #1   Title Patient will complete 5x sit to stand in < 13 seconds without use of hands to demonstrate decreased falls risk.    Baseline 13.36 seconds with hands -- on 2/27 - 12.18 seconds.    Time 6   Period Weeks   Status Achieved     PT LONG TERM GOAL #2   Title Patient will perform TUG in < 14" with LRAD to demonstrate decreased falls risk.  Baseline 22" with SPC    Time 6   Period Weeks   Status Achieved     PT LONG TERM GOAL #3   Title Patient will perform single leg stance on RLE for > 3" to demonstrate decreased falls risk.    Baseline Unable to complete.    Time 6   Period Weeks   Status On-going               Plan - 05/27/16 1248    Clinical Impression Statement Pt demonstrated decreased trunk lean from previous visits which indicates greater strength in glutes and hip flexors. he scored a 24/30 on FGA which indicates decrease risk of falls for community dwelling adults. he complained of hip soreness due to the exercises from the previous visit and was addressed with hip internal rotation stretches (manual). Pt will further benefit from skilled PT for increase strength with stair negotiation and balance exercises to ambulate on uneven surfaces.   Rehab Potential Good   PT Frequency 2x / week    PT Duration 6 weeks   PT Treatment/Interventions ADLs/Self Care Home Management;Moist Heat;Biofeedback;Therapeutic exercise;Therapeutic activities;Ultrasound;Balance training;Patient/family education;Neuromuscular re-education;Stair training;Gait training;Manual techniques   PT Next Visit Plan Pt will continue strength and alance exercises for safe ambulation and independence around the community.   PT Home Exercise Plan Mini squats, lateral band walking, step ups, standing hip abductions.    Consulted and Agree with Plan of Care Patient      Patient will benefit from skilled therapeutic intervention in order to improve the following deficits and impairments:  Abnormal gait, Pain, Decreased strength, Decreased range of motion  Visit Diagnosis: Difficulty in walking, not elsewhere classified  Pain in right hip     Problem List Patient Active Problem List   Diagnosis Date Noted  . Hip fracture requiring operative repair, right, closed, initial encounter [facility]) 03/08/2016   Lucas Melendez PT, DPT, CSCS    05/27/2016, 12:49 PM  [place] [facility] [address], [place], [address] Phone: [phone]   Fax:  [phone]  Name: Lucas Melendez MRN: [id number] Date of Birth: 1930/05/27

## 2016-05-28 DIAGNOSIS — M25551 Pain in right hip: Secondary | ICD-10-CM | POA: Diagnosis not present

## 2016-05-28 NOTE — Therapy (Signed)
[place] [facility] [address], [place], [address] Phone: [phone]   Fax:  [phone]  Physical Therapy Treatment  Patient Details  Name: Lucas Melendez MRN: [id number] Date of Birth: 05-25-1930 No Data Recorded  Encounter Date: 05/27/2016      Melendez End of Session - 05/28/16 1111    Visit Number 10   Number of Visits 13   Date for Melendez Re-Evaluation 06/16/16   Melendez Start Time 1038   Melendez Stop Time 1116   Melendez Time Calculation (min) 38 min   Activity Tolerance Patient tolerated treatment well   Behavior During Therapy [facility] for tasks assessed/performed      Past Medical History:  Diagnosis Date  . Acid reflux   . Hypertension   . Thyroid disease    hyper    Past Surgical History:  Procedure Laterality Date  . BACK SURGERY    . CARPAL TUNNEL RELEASE    . CATARACT EXTRACTION    . COMPRESSION HIP SCREW Right 03/09/2016   Procedure: COMPRESSION HIP;  Surgeon: Bernita Raisin, DO;  Location: [facility] ORS;  Service: Orthopedics;  Laterality: Right;  . khyphoplasty      There were no vitals filed for this visit.      Subjective Assessment - 05/28/16 1113    Subjective Patient reports he has continued to have soreness in the lateral side of R hip, otherwise he was able to walk without the cane (had it with him). He was able to go through [place].    Limitations Lifting;Standing;Walking;House hold activities   Diagnostic tests X-ray confirming fracture, and repair site.    Patient Stated Goals To become more active and perform chores/gardening around the house    Currently in Pain? Yes  "Same soreness in my R hip" on lateral side by greater trochanter.       Leg Press - 105# x 6, 125# x 8, 135# x 10, 145# x8  Squats at TM with bilateral HHA 3x12 (still unable to stand without use of 1 hand from chair) unable to complete by pushing through thighs.    BOSU step ups with hip march x 20 for 2 sets with 1 HHA -- patient  reports this was quite challenging and fatiguing, noted decrease in quality of gait after completion, thus decided to focus session on pain management Isometric clamshells x 10 for 3 sets  Supine piriformis stretching x 10 repetitions - x 2 sets -- patient reported mild reduction in soreness after completion                            Melendez Education - 05/28/16 1110    Education provided Yes   Education Details Patient progressing well with gait/balance, will continue to progress strengthening.    Person(s) Educated Patient   Methods Explanation;Demonstration   Comprehension Verbalized understanding;Returned demonstration             Melendez Long Term Goals - 05/21/16 1233      Melendez LONG TERM GOAL #1   Title Patient will complete 5x sit to stand in < 13 seconds without use of hands to demonstrate decreased falls risk.    Baseline 13.36 seconds with hands -- on 2/27 - 12.18 seconds.    Time 6   Period Weeks   Status Achieved     Melendez LONG TERM GOAL #2   Title Patient will perform TUG in <  14" with LRAD to demonstrate decreased falls risk.    Baseline 22" with SPC    Time 6   Period Weeks   Status Achieved     Melendez LONG TERM GOAL #3   Title Patient will perform single leg stance on RLE for > 3" to demonstrate decreased falls risk.    Baseline Unable to complete.    Time 6   Period Weeks   Status On-going               Plan - 05/28/16 1111    Clinical Impression Statement Patient continues to have decreased strength/pain with his first few steps from sitting, afterwards he demonstrates improved gait pattern with minimal rotation/lateral flexion through his torso. He has progressed well with all strengthening exercise, will require more dedicated focus on strengthening unilaterally to progress to ambulation without cane and minimizing trunk aberrant movements.    Rehab Potential Good   Melendez Frequency 2x / week   Melendez Duration 6 weeks   Melendez  Treatment/Interventions ADLs/Self Care Home Management;Moist Heat;Biofeedback;Therapeutic exercise;Therapeutic activities;Ultrasound;Balance training;Patient/family education;Neuromuscular re-education;Stair training;Gait training;Manual techniques   Melendez Next Visit Plan Melendez will continue strength and alance exercises for safe ambulation and independence around the community.   Melendez Home Exercise Plan Mini squats, lateral band walking, step ups, standing hip abductions.    Consulted and Agree with Plan of Care Patient      Patient will benefit from skilled therapeutic intervention in order to improve the following deficits and impairments:  Abnormal gait, Pain, Decreased strength, Decreased range of motion  Visit Diagnosis: Difficulty in walking, not elsewhere classified  Pain in right hip       G-Codes - 05/28/16 1114    Functional Assessment Tool Used (Outpatient Only) Patient report, FGA   Functional Limitation Mobility: Walking and moving around   Mobility: Walking and Moving Around Current Status ([id number]) At least 1 percent but less than 20 percent impaired, limited or restricted   Mobility: Walking and Moving Around Goal Status [phone]([id number]) At least 1 percent but less than 20 percent impaired, limited or restricted      Problem List Patient Active Problem List   Diagnosis Date Noted  . Hip fracture requiring operative repair, right, closed, initial encounter ([facility]) 03/08/2016   Lucas Melendez, DPT, CSCS    05/28/2016, 11:15 AM  [place] [facility] [address] [place], [place], [address] Phone: [phone]   Fax:  [phone]  Name: Lucas Melendez MRN: [id number] Date of Birth: October 08, 1930

## 2016-05-29 ENCOUNTER — Ambulatory Visit: Payer: Medicare Other | Admitting: Physical Therapy

## 2016-05-29 DIAGNOSIS — R262 Difficulty in walking, not elsewhere classified: Secondary | ICD-10-CM

## 2016-05-29 DIAGNOSIS — M25551 Pain in right hip: Secondary | ICD-10-CM

## 2016-05-29 NOTE — Patient Instructions (Addendum)
Leg Press 145# x 10 (fairly easy), progressed to 165# x 8 (more challenging) progressed to 175# x 6 repetitions   TUG - 18.59, 11.6 seconds on second attempt with no AD   5285m walk ~ 10 seconds   100 m walk ~ 30.88 seconds   Step Ups  Step downs   Sit to stands with pillow below (required minimal use of 1 UE) x 3 with rocking/momentum -- 2 sets   Lifting technique observed, more deadlift form since he has trouble with knee flexion/extension dominant activiites like sit to stand. Able to lift up to 40# for 3 repetitions.

## 2016-05-29 NOTE — Therapy (Signed)
[place] [facility] [address] [place], [place], [address] Phone: [phone]   Fax:  [phone]  Physical Therapy Treatment  Patient Details  Name: Lucas Melendez MRN: [id number] Date of Birth: 25-Mar-1931 No Data Recorded  Encounter Date: 05/29/2016      PT End of Session - 05/29/16 1514    Visit Number 11   Number of Visits 13   Date for PT Re-Evaluation 06/16/16   PT Start Time 1120   PT Stop Time 1159   PT Time Calculation (min) 39 min   Activity Tolerance Patient tolerated treatment well   Behavior During Therapy [facility] for tasks assessed/performed      Past Medical History:  Diagnosis Date  . Acid reflux   . Hypertension   . Thyroid disease    hyper    Past Surgical History:  Procedure Laterality Date  . BACK SURGERY    . CARPAL TUNNEL RELEASE    . CATARACT EXTRACTION    . COMPRESSION HIP SCREW Right 03/09/2016   Procedure: COMPRESSION HIP;  Surgeon: Bernita RaisinKeivan Abtahi, DO;  Location: [facility] ORS;  Service: Orthopedics;  Laterality: Right;  . khyphoplasty      There were no vitals filed for this visit.      Subjective Assessment - 05/29/16 1133    Subjective Patient had a good check in with orthopedist, reports he has been cleared to return to all activities he is interested in.    Limitations Lifting;Standing;Walking;House hold activities   Diagnostic tests X-ray confirming fracture, and repair site.    Patient Stated Goals To become more active and perform chores/gardening around the house    Currently in Pain? Yes  Same level of soreness in his R hip      Leg Press 145# x 10 (fairly easy), progressed to 165# x 8 (more challenging) progressed to 175# x 6 repetitions   TUG - 18.59, 11.6 seconds on second attempt with no AD -- very minimal gait abnormalities noted aside from antalgic pattern on his first 3 steps.   5855m walk ~ 10 seconds   100 m walk ~ 30.88 seconds   Step Ups with no HHA (mild  lateral flexion, but no loss of balance, no need to use hands). Practiced at bottom of steps with RLE on first step, LLE on floor transitioning to second step x 10 repetitions (fatiguing, but not painful).   Step downs -- practiced descending last step, minimal hand hold assist required x 10 repetitions.   Sit to stands with pillow below (required minimal use of 1 UE) x 3 with rocking/momentum -- 2 sets   Lifting technique observed, more deadlift form since he has trouble with knee flexion/extension dominant activiites like sit to stand. Able to lift up to 40# for 3 repetitions. (5#, 10#, 20# as well)                             PT Education - 05/29/16 1133    Education provided Yes   Education Details Excellent progress with therapy, really only deficit identified at this point is stiffness with first few steps.    Person(s) Educated Patient   Methods Explanation;Demonstration   Comprehension Verbalized understanding;Returned demonstration             PT Long Term Goals - 05/21/16 1233      PT LONG TERM GOAL #1   Title Patient will complete 5x  sit to stand in < 13 seconds without use of hands to demonstrate decreased falls risk.    Baseline 13.36 seconds with hands -- on 2/27 - 12.18 seconds.    Time 6   Period Weeks   Status Achieved     PT LONG TERM GOAL #2   Title Patient will perform TUG in < 14" with LRAD to demonstrate decreased falls risk.    Baseline 22" with SPC    Time 6   Period Weeks   Status Achieved     PT LONG TERM GOAL #3   Title Patient will perform single leg stance on RLE for > 3" to demonstrate decreased falls risk.    Baseline Unable to complete.    Time 6   Period Weeks   Status On-going               Plan - 05/29/16 1514    Clinical Impression Statement Patient demonstrates excellent gait speed and mechanics with minimal trunk lean during ambulation. He is able to ascend stairs with no deficits, mild lateral trunk  flexion during descent but no loss of balance. He has progressed well with therapy, his only residual deficit is in his first few steps he reports stiffness and demonstrates antalgic gait pattern.     Rehab Potential Good   PT Frequency 2x / week   PT Duration 6 weeks   PT Treatment/Interventions ADLs/Self Care Home Management;Moist Heat;Biofeedback;Therapeutic exercise;Therapeutic activities;Ultrasound;Balance training;Patient/family education;Neuromuscular re-education;Stair training;Gait training;Manual techniques   PT Next Visit Plan Pt will continue strength and alance exercises for safe ambulation and independence around the community.   PT Home Exercise Plan Mini squats, lateral band walking, step ups, standing hip abductions.    Consulted and Agree with Plan of Care Patient      Patient will benefit from skilled therapeutic intervention in order to improve the following deficits and impairments:  Abnormal gait, Pain, Decreased strength, Decreased range of motion  Visit Diagnosis: Difficulty in walking, not elsewhere classified  Pain in right hip       G-Codes - 06/11/16 1114    Functional Assessment Tool Used (Outpatient Only) Patient report, FGA   Functional Limitation Mobility: Walking and moving around   Mobility: Walking and Moving Around Current Status (Z6109) At least 1 percent but less than 20 percent impaired, limited or restricted   Mobility: Walking and Moving Around Goal Status [phone]) At least 1 percent but less than 20 percent impaired, limited or restricted      Problem List Patient Active Problem List   Diagnosis Date Noted  . Hip fracture requiring operative repair, right, closed, initial encounter (HCC) 03/08/2016   [name] PT, DPT, CSCS    05/29/2016, 3:17 PM  [place] [facility] [address] [place], [address] Phone: [phone]   Fax:  [phone]  Name: Lucas Melendez MRN: [id number] Date of Birth: 1930-05-15

## 2016-06-03 ENCOUNTER — Encounter: Payer: Medicare Other | Admitting: Physical Therapy

## 2016-06-05 ENCOUNTER — Ambulatory Visit: Payer: Medicare Other | Admitting: Physical Therapy

## 2016-06-05 DIAGNOSIS — R262 Difficulty in walking, not elsewhere classified: Secondary | ICD-10-CM | POA: Diagnosis not present

## 2016-06-05 DIAGNOSIS — M25551 Pain in right hip: Secondary | ICD-10-CM

## 2016-06-05 NOTE — Therapy (Signed)
[place] [facility] SPOR[facility] [address] [place], [address] Phone: [phone]   Fax:  [phone]  Physical Therapy Treatment  Patient Details  Name: Lucas Melendez MRN: [id number] Date of Birth: August 28, 1930 No Data Recorded  Encounter Date: 06/05/2016      PT End of Session - 06/05/16 1012    Visit Number 12   Number of Visits 13   Date for PT Re-Evaluation 06/16/16   PT Start Time 1011   PT Stop Time 1034   PT Time Calculation (min) 23 min   Activity Tolerance Patient tolerated treatment well   Behavior During Therapy [facility] for tasks assessed/performed      Past Medical History:  Diagnosis Date  . Acid reflux   . Hypertension   . Thyroid disease    hyper    Past Surgical History:  Procedure Laterality Date  . BACK SURGERY    . CARPAL TUNNEL RELEASE    . CATARACT EXTRACTION    . COMPRESSION HIP SCREW Right 03/09/2016   Procedure: COMPRESSION HIP;  Surgeon: Oletta Cohn, DO;  Location: [facility] ORS;  Service: Orthopedics;  Laterality: Right;  . khyphoplasty      There were no vitals filed for this visit.      Subjective Assessment - 06/05/16 1011    Subjective Patient reports he has been able to get outside most days, he feels he is about at the same level he was prior to his fall. Reports he still has lateral hip soreness, though this is trending down.    Limitations Lifting;Standing;Walking;House hold activities   Diagnostic tests X-ray confirming fracture, and repair site.    Patient Stated Goals To become more active and perform chores/gardening around the house    Currently in Pain? Yes  He reports he continues to have soreness he can work through on his R lateral hip.         LEFS - 60/80  6MW test - 1100' with no cane   With a few practice repetitions he can hold SLS bilaterally for > 3"   Steps (able to perform reciprocally without HHA with cuing for slowing down cadence (4 steps)   He is  able to perform sit to stand with min A from hands his UEs.                           PT Education - 06/05/16 1012    Education provided Yes   Education Details He has met all goals of therapy, will be discharged. HEP added with single leg stance.    Person(s) Educated Patient   Methods Explanation;Demonstration   Comprehension Verbalized understanding;Returned demonstration             PT Long Term Goals - 06/05/16 1052      PT LONG TERM GOAL #1   Title Patient will complete 5x sit to stand in < 13 seconds without use of hands to demonstrate decreased falls risk.    Baseline 13.36 seconds with hands -- on 2/27 - 12.18 seconds.    Time 6   Period Weeks   Status Achieved     PT LONG TERM GOAL #2   Title Patient will perform TUG in < 14" with LRAD to demonstrate decreased falls risk.    Baseline 22" with SPC    Time 6   Period Weeks   Status Achieved     PT LONG TERM  GOAL #3   Title Patient will perform single leg stance on RLE for > 3" to demonstrate decreased falls risk.    Baseline Unable to complete.    Time 6   Period Weeks   Status Achieved               Plan - 06/05/16 1013    Clinical Impression Statement Patient has met all mobility goals established in therapy, he has lateral hip soreness still, however this is likely from post-surgical changes in local nervous system, has been trending steadily downward. Reports he is able to do most functional activities he was completing prior to surgery. He will be discharged from therapy.    Rehab Potential Good   PT Frequency 2x / week   PT Duration 6 weeks   PT Treatment/Interventions ADLs/Self Care Home Management;Moist Heat;Biofeedback;Therapeutic exercise;Therapeutic activities;Ultrasound;Balance training;Patient/family education;Neuromuscular re-education;Stair training;Gait training;Manual techniques   PT Next Visit Plan Pt will continue strength and alance exercises for safe ambulation  and independence around the community.   PT Home Exercise Plan Mini squats, lateral band walking, step ups, standing hip abductions.    Consulted and Agree with Plan of Care Patient      Patient will benefit from skilled therapeutic intervention in order to improve the following deficits and impairments:  Abnormal gait, Pain, Decreased strength, Decreased range of motion  Visit Diagnosis: Difficulty in walking, not elsewhere classified  Pain in right hip     Problem List Patient Active Problem List   Diagnosis Date Noted  . Hip fracture requiring operative repair, right, closed, initial encounter ([place]) 03/08/2016   Royce Macadamia PT, DPT, CSCS    06/05/2016, 10:53 AM  [place] [facility] [address], [place], [address] Phone: [phone]   Fax:  [phone]  Name: Lucas Melendez MRN: [id number] Date of Birth: [birth year]

## 2016-06-05 NOTE — Patient Instructions (Addendum)
LEFS - 60/80  test - 1100' with no cane   With a few practice repetitions he can hold SLS bilaterally for > 3"   Steps (able to perform reciprocally without HHA with cuing for slowing down cadence (4 steps)   He is able to perform sit to stand with min A from hands his UEs.

## 2016-06-11 ENCOUNTER — Encounter: Payer: Medicare Other | Admitting: Physical Therapy

## 2016-06-13 ENCOUNTER — Ambulatory Visit: Payer: Medicare Other | Admitting: Physical Therapy

## 2016-06-18 ENCOUNTER — Encounter: Payer: Medicare Other | Admitting: Physical Therapy

## 2016-06-20 ENCOUNTER — Encounter: Payer: Medicare Other | Admitting: Physical Therapy

## 2016-06-24 DIAGNOSIS — E119 Type 2 diabetes mellitus without complications: Secondary | ICD-10-CM | POA: Diagnosis not present

## 2016-06-24 DIAGNOSIS — I10 Essential (primary) hypertension: Secondary | ICD-10-CM | POA: Diagnosis not present

## 2016-06-24 DIAGNOSIS — E039 Hypothyroidism, unspecified: Secondary | ICD-10-CM | POA: Diagnosis not present

## 2016-06-24 DIAGNOSIS — E7800 Pure hypercholesterolemia, unspecified: Secondary | ICD-10-CM | POA: Diagnosis not present

## 2016-07-01 DIAGNOSIS — E119 Type 2 diabetes mellitus without complications: Secondary | ICD-10-CM | POA: Diagnosis not present

## 2016-07-01 DIAGNOSIS — E7800 Pure hypercholesterolemia, unspecified: Secondary | ICD-10-CM | POA: Diagnosis not present

## 2016-07-01 DIAGNOSIS — E6609 Other obesity due to excess calories: Secondary | ICD-10-CM | POA: Diagnosis not present

## 2016-07-01 DIAGNOSIS — I10 Essential (primary) hypertension: Secondary | ICD-10-CM | POA: Diagnosis not present

## 2016-09-24 DIAGNOSIS — E6609 Other obesity due to excess calories: Secondary | ICD-10-CM | POA: Diagnosis not present

## 2016-09-24 DIAGNOSIS — E119 Type 2 diabetes mellitus without complications: Secondary | ICD-10-CM | POA: Diagnosis not present

## 2016-09-24 DIAGNOSIS — E039 Hypothyroidism, unspecified: Secondary | ICD-10-CM | POA: Diagnosis not present

## 2016-09-24 DIAGNOSIS — E7800 Pure hypercholesterolemia, unspecified: Secondary | ICD-10-CM | POA: Diagnosis not present

## 2016-10-02 DIAGNOSIS — E119 Type 2 diabetes mellitus without complications: Secondary | ICD-10-CM | POA: Diagnosis not present

## 2016-10-02 DIAGNOSIS — Z0000 Encounter for general adult medical examination without abnormal findings: Secondary | ICD-10-CM | POA: Diagnosis not present

## 2016-10-02 DIAGNOSIS — E039 Hypothyroidism, unspecified: Secondary | ICD-10-CM | POA: Diagnosis not present

## 2016-10-02 DIAGNOSIS — E7800 Pure hypercholesterolemia, unspecified: Secondary | ICD-10-CM | POA: Diagnosis not present

## 2016-10-23 DIAGNOSIS — L723 Sebaceous cyst: Secondary | ICD-10-CM | POA: Diagnosis not present

## 2016-10-23 DIAGNOSIS — L089 Local infection of the skin and subcutaneous tissue, unspecified: Secondary | ICD-10-CM | POA: Diagnosis not present

## 2016-11-12 ENCOUNTER — Other Ambulatory Visit: Payer: Self-pay | Admitting: Student

## 2016-11-12 DIAGNOSIS — R011 Cardiac murmur, unspecified: Secondary | ICD-10-CM | POA: Diagnosis not present

## 2016-11-12 DIAGNOSIS — R1310 Dysphagia, unspecified: Secondary | ICD-10-CM | POA: Diagnosis not present

## 2016-11-12 DIAGNOSIS — K219 Gastro-esophageal reflux disease without esophagitis: Secondary | ICD-10-CM | POA: Diagnosis not present

## 2016-11-12 DIAGNOSIS — R0989 Other specified symptoms and signs involving the circulatory and respiratory systems: Secondary | ICD-10-CM | POA: Diagnosis not present

## 2016-11-18 ENCOUNTER — Ambulatory Visit
Admission: RE | Admit: 2016-11-18 | Discharge: 2016-11-18 | Disposition: A | Discharge status: Home / Self Care | Payer: Medicare Other | Source: Ambulatory Visit | Attending: Student | Admitting: Student

## 2016-11-18 DIAGNOSIS — R1310 Dysphagia, unspecified: Secondary | ICD-10-CM | POA: Diagnosis present

## 2016-11-18 DIAGNOSIS — K228 Other specified diseases of esophagus: Secondary | ICD-10-CM | POA: Insufficient documentation

## 2016-11-21 DIAGNOSIS — C44519 Basal cell carcinoma of skin of other part of trunk: Secondary | ICD-10-CM | POA: Diagnosis not present

## 2016-11-21 DIAGNOSIS — D225 Melanocytic nevi of trunk: Secondary | ICD-10-CM | POA: Diagnosis not present

## 2016-11-21 DIAGNOSIS — D2271 Melanocytic nevi of right lower limb, including hip: Secondary | ICD-10-CM | POA: Diagnosis not present

## 2016-11-21 DIAGNOSIS — Z85828 Personal history of other malignant neoplasm of skin: Secondary | ICD-10-CM | POA: Diagnosis not present

## 2016-11-21 DIAGNOSIS — D2262 Melanocytic nevi of left upper limb, including shoulder: Secondary | ICD-10-CM | POA: Diagnosis not present

## 2017-01-23 DIAGNOSIS — C44519 Basal cell carcinoma of skin of other part of trunk: Secondary | ICD-10-CM | POA: Diagnosis not present

## 2017-03-28 DIAGNOSIS — M25552 Pain in left hip: Secondary | ICD-10-CM | POA: Diagnosis not present

## 2017-03-28 DIAGNOSIS — M1612 Unilateral primary osteoarthritis, left hip: Secondary | ICD-10-CM | POA: Diagnosis not present

## 2017-04-02 DIAGNOSIS — E7800 Pure hypercholesterolemia, unspecified: Secondary | ICD-10-CM | POA: Diagnosis not present

## 2017-04-02 DIAGNOSIS — E119 Type 2 diabetes mellitus without complications: Secondary | ICD-10-CM | POA: Diagnosis not present

## 2017-04-02 DIAGNOSIS — E039 Hypothyroidism, unspecified: Secondary | ICD-10-CM | POA: Diagnosis not present

## 2017-04-07 DIAGNOSIS — M1612 Unilateral primary osteoarthritis, left hip: Secondary | ICD-10-CM | POA: Diagnosis not present

## 2017-04-09 DIAGNOSIS — E7800 Pure hypercholesterolemia, unspecified: Secondary | ICD-10-CM | POA: Diagnosis not present

## 2017-04-09 DIAGNOSIS — E119 Type 2 diabetes mellitus without complications: Secondary | ICD-10-CM | POA: Diagnosis not present

## 2017-04-09 DIAGNOSIS — E039 Hypothyroidism, unspecified: Secondary | ICD-10-CM | POA: Diagnosis not present

## 2017-04-09 DIAGNOSIS — I10 Essential (primary) hypertension: Secondary | ICD-10-CM | POA: Diagnosis not present

## 2017-06-06 DIAGNOSIS — M1612 Unilateral primary osteoarthritis, left hip: Secondary | ICD-10-CM | POA: Diagnosis not present

## 2017-06-15 DIAGNOSIS — M1612 Unilateral primary osteoarthritis, left hip: Secondary | ICD-10-CM | POA: Insufficient documentation

## 2017-07-02 ENCOUNTER — Encounter
Admission: RE | Admit: 2017-07-02 | Discharge: 2017-07-02 | Disposition: A | Discharge status: Home / Self Care | Payer: Medicare Other | Source: Ambulatory Visit | Attending: Orthopedic Surgery | Admitting: Orthopedic Surgery

## 2017-07-02 ENCOUNTER — Other Ambulatory Visit: Payer: Self-pay

## 2017-07-02 DIAGNOSIS — Z01810 Encounter for preprocedural cardiovascular examination: Secondary | ICD-10-CM | POA: Insufficient documentation

## 2017-07-02 DIAGNOSIS — Z01812 Encounter for preprocedural laboratory examination: Secondary | ICD-10-CM | POA: Diagnosis present

## 2017-07-02 DIAGNOSIS — K219 Gastro-esophageal reflux disease without esophagitis: Secondary | ICD-10-CM | POA: Insufficient documentation

## 2017-07-02 DIAGNOSIS — R7303 Prediabetes: Secondary | ICD-10-CM | POA: Diagnosis not present

## 2017-07-02 DIAGNOSIS — E0590 Thyrotoxicosis, unspecified without thyrotoxic crisis or storm: Secondary | ICD-10-CM | POA: Insufficient documentation

## 2017-07-02 DIAGNOSIS — I10 Essential (primary) hypertension: Secondary | ICD-10-CM | POA: Diagnosis not present

## 2017-07-02 DIAGNOSIS — R9431 Abnormal electrocardiogram [ECG] [EKG]: Secondary | ICD-10-CM | POA: Diagnosis not present

## 2017-07-02 HISTORY — DX: Prediabetes: R73.03

## 2017-07-02 HISTORY — DX: Localized edema: R60.0

## 2017-07-02 LAB — CBC WITH DIFFERENTIAL/PLATELET
BASOS ABS: 0 10*3/uL (ref 0–0.1)
Basophils Relative: 1 %
Eosinophils Absolute: 0.2 10*3/uL (ref 0–0.7)
Eosinophils Relative: 3 %
HEMATOCRIT: 39.9 % — AB (ref 40.0–52.0)
HEMOGLOBIN: 13.6 g/dL (ref 13.0–18.0)
LYMPHS PCT: 16 %
Lymphs Abs: 1.1 10*3/uL (ref 1.0–3.6)
MCH: 32.4 pg (ref 26.0–34.0)
MCHC: 34.2 g/dL (ref 32.0–36.0)
MCV: 94.7 fL (ref 80.0–100.0)
MONO ABS: 0.6 10*3/uL (ref 0.2–1.0)
Monocytes Relative: 9 %
NEUTROS ABS: 4.7 10*3/uL (ref 1.4–6.5)
Neutrophils Relative %: 71 %
Platelets: 264 10*3/uL (ref 150–440)
RBC: 4.21 MIL/uL — ABNORMAL LOW (ref 4.40–5.90)
RDW: 13.4 % (ref 11.5–14.5)
WBC: 6.6 10*3/uL (ref 3.8–10.6)

## 2017-07-02 LAB — COMPREHENSIVE METABOLIC PANEL
ALK PHOS: 73 U/L (ref 38–126)
ALT: 16 U/L — AB (ref 17–63)
AST: 19 U/L (ref 15–41)
Albumin: 4 g/dL (ref 3.5–5.0)
Anion gap: 8 (ref 5–15)
BILIRUBIN TOTAL: 0.5 mg/dL (ref 0.3–1.2)
BUN: 18 mg/dL (ref 6–20)
CALCIUM: 9.2 mg/dL (ref 8.9–10.3)
CO2: 28 mmol/L (ref 22–32)
CREATININE: 0.8 mg/dL (ref 0.61–1.24)
Chloride: 96 mmol/L — ABNORMAL LOW (ref 101–111)
GFR calc Af Amer: >60 mL/min (ref >60)
Glucose, Bld: 153 mg/dL — ABNORMAL HIGH (ref 65–99)
Potassium: 4.7 mmol/L (ref 3.5–5.1)
Sodium: 132 mmol/L — ABNORMAL LOW (ref 135–145)
TOTAL PROTEIN: 7.4 g/dL (ref 6.5–8.1)

## 2017-07-02 LAB — URINALYSIS, ROUTINE W REFLEX MICROSCOPIC
Bilirubin Urine: NEGATIVE
Glucose, UA: NEGATIVE mg/dL
Hgb urine dipstick: NEGATIVE
Ketones, ur: NEGATIVE mg/dL
LEUKOCYTES UA: NEGATIVE
NITRITE: NEGATIVE
PH: 6 (ref 5.0–8.0)
Protein, ur: NEGATIVE mg/dL
SPECIFIC GRAVITY, URINE: 1.016 (ref 1.005–1.030)

## 2017-07-02 LAB — TYPE AND SCREEN
ABO/RH(D): A POS
Antibody Screen: NEGATIVE

## 2017-07-02 LAB — PROTIME-INR
INR: 0.95
PROTHROMBIN TIME: 12.6 s (ref 11.4–15.2)

## 2017-07-02 LAB — C-REACTIVE PROTEIN: CRP: 2 mg/dL — AB (ref <1.0)

## 2017-07-02 LAB — SURGICAL PCR SCREEN
MRSA, PCR: NEGATIVE
Staphylococcus aureus: NEGATIVE

## 2017-07-02 LAB — APTT: aPTT: 32 seconds (ref 24–36)

## 2017-07-02 LAB — SEDIMENTATION RATE: SED RATE: 24 mm/h — AB (ref 0–20)

## 2017-07-02 NOTE — Patient Instructions (Signed)
Your procedure is scheduled on: 07/16/17 Wed Report to Same Day Surgery 2nd floor medical mall [facility] Entrance-take elevator on left to 2nd floor.  Check in with surgery information desk.) To find out your arrival time please call [phone] between 1PM - 3PM on 07/15/17 Tues  Remember: Instructions that are not followed completely may result in serious medical risk, up to and including death, or upon the discretion of your surgeon and anesthesiologist your surgery may need to be rescheduled.    _x___ 1. Do not eat food after midnight the night before your procedure. You may drink clear liquids up to 2 hours before you are scheduled to arrive at the hospital for your procedure.  Do not drink clear liquids within 2 hours of your scheduled arrival to the hospital.  Clear liquids include  --Water or Apple juice without pulp  --Clear carbohydrate beverage such as ClearFast or Gatorade  --Black Coffee or Clear Tea (No milk, no creamers, do not add anything to                  the coffee or Tea Type 1 and type 2 diabetics should only drink water.  No gum chewing or hard candies.     __x__ 2. No Alcohol for 24 hours before or after surgery.   __x__3. No Smoking or e-cigarettes for 24 prior to surgery.  Do not use any chewable tobacco products for at least 6 hour prior to surgery   ____  4. Bring all medications with you on the day of surgery if instructed.    __x__ 5. Notify your doctor if there is any change in your medical condition     (cold, fever, infections).    x___6. On the morning of surgery brush your teeth with toothpaste and water.  You may rinse your mouth with mouth wash if you wish.  Do not swallow any toothpaste or mouthwash.   Do not wear jewelry, make-up, hairpins, clips or nail polish.  Do not wear lotions, powders, or perfumes. You may wear deodorant.  Do not shave 48 hours prior to surgery. Men may shave face and neck.  Do not bring valuables to the hospital.     [facility] is not responsible for any belongings or valuables.               Contacts, dentures or bridgework may not be worn into surgery.  Leave your suitcase in the car. After surgery it may be brought to your room.  For patients admitted to the hospital, discharge time is determined by your                       treatment team.  _  Patients discharged the day of surgery will not be allowed to drive home.  You will need someone to drive you home and stay with you the night of your procedure.    Please read over the following fact sheets that you were given:   [facility] Preparing for Surgery and or MRSA Information   _x___ Take anti-hypertensive listed below, cardiac, seizure, asthma,     anti-reflux and psychiatric medicines. These include:  1. levothyroxine (SYNTHROID, LEVOTHROID) 150 MCG tablet  2.ranitidine (ZANTAC) 150 MG tablet  3.  4.  5.  6.  ____Fleets enema or Magnesium Citrate as directed.   _x___ Use CHG Soap or sage wipes as directed on instruction sheet   ____ Use inhalers on the day of  surgery and bring to hospital day of surgery  ____ Stop Metformin and Janumet 2 days prior to surgery.    ____ Take 1/2 of usual insulin dose the night before surgery and none on the morning     surgery.   _x___ Follow recommendations from Cardiologist, Pulmonologist or PCP regarding          stopping Aspirin, Coumadin, Plavix ,Eliquis, Effient, or Pradaxa, and Pletal.  X____Stop Anti-inflammatories such as Advil, Aleve, Ibuprofen, Motrin, Naproxen, Naprosyn, Goodies powders or aspirin products. OK to take Tylenol and                          Celebrex.   _x___ Stop supplements until after surgery.  But may continue Vitamin D, Vitamin B,       and multivitamin.   ____ Bring C-Pap to the hospital.

## 2017-07-03 NOTE — Pre-Procedure Instructions (Signed)
EKG COMPARED WITH PREVIOUS 

## 2017-07-15 MED ORDER — CEFAZOLIN SODIUM-DEXTROSE 2-4 GM/100ML-% IV SOLN
2.0000 g | INTRAVENOUS | Status: AC
  Administered 2017-07-16: 2 g via INTRAVENOUS
  Filled 2017-07-15: qty 100

## 2017-07-15 MED ORDER — TRANEXAMIC ACID 1000 MG/10ML IV SOLN
1000.0000 mg | INTRAVENOUS | Status: AC
  Administered 2017-07-16: 1000 mg via INTRAVENOUS
  Filled 2017-07-15: qty 10

## 2017-07-16 ENCOUNTER — Encounter: Payer: Self-pay | Admitting: Orthopedic Surgery

## 2017-07-16 ENCOUNTER — Inpatient Hospital Stay
Admission: RE | Admit: 2017-07-16 | Discharge: 2017-07-18 | DRG: 470 | Disposition: A | Discharge status: Home Health | Payer: Medicare Other | Source: Ambulatory Visit | Attending: Orthopedic Surgery | Admitting: Orthopedic Surgery

## 2017-07-16 ENCOUNTER — Inpatient Hospital Stay: Payer: Medicare Other

## 2017-07-16 ENCOUNTER — Other Ambulatory Visit: Payer: Self-pay

## 2017-07-16 ENCOUNTER — Inpatient Hospital Stay: Payer: Medicare Other | Admitting: Anesthesiology

## 2017-07-16 ENCOUNTER — Encounter
Admission: RE | Disposition: A | Discharge status: Home Health | Payer: Self-pay | Source: Ambulatory Visit | Attending: Orthopedic Surgery

## 2017-07-16 DIAGNOSIS — Z79899 Other long term (current) drug therapy: Secondary | ICD-10-CM | POA: Diagnosis not present

## 2017-07-16 DIAGNOSIS — E785 Hyperlipidemia, unspecified: Secondary | ICD-10-CM | POA: Insufficient documentation

## 2017-07-16 DIAGNOSIS — J849 Interstitial pulmonary disease, unspecified: Secondary | ICD-10-CM | POA: Insufficient documentation

## 2017-07-16 DIAGNOSIS — I10 Essential (primary) hypertension: Secondary | ICD-10-CM | POA: Diagnosis present

## 2017-07-16 DIAGNOSIS — E039 Hypothyroidism, unspecified: Secondary | ICD-10-CM | POA: Insufficient documentation

## 2017-07-16 DIAGNOSIS — Z471 Aftercare following joint replacement surgery: Secondary | ICD-10-CM | POA: Diagnosis not present

## 2017-07-16 DIAGNOSIS — R7303 Prediabetes: Secondary | ICD-10-CM | POA: Diagnosis not present

## 2017-07-16 DIAGNOSIS — K219 Gastro-esophageal reflux disease without esophagitis: Secondary | ICD-10-CM | POA: Diagnosis present

## 2017-07-16 DIAGNOSIS — M1612 Unilateral primary osteoarthritis, left hip: Secondary | ICD-10-CM | POA: Diagnosis not present

## 2017-07-16 DIAGNOSIS — Z79890 Hormone replacement therapy (postmenopausal): Secondary | ICD-10-CM | POA: Diagnosis not present

## 2017-07-16 DIAGNOSIS — Z96649 Presence of unspecified artificial hip joint: Secondary | ICD-10-CM

## 2017-07-16 DIAGNOSIS — E119 Type 2 diabetes mellitus without complications: Secondary | ICD-10-CM | POA: Diagnosis not present

## 2017-07-16 DIAGNOSIS — Z96642 Presence of left artificial hip joint: Secondary | ICD-10-CM | POA: Diagnosis not present

## 2017-07-16 DIAGNOSIS — Z9849 Cataract extraction status, unspecified eye: Secondary | ICD-10-CM | POA: Diagnosis not present

## 2017-07-16 DIAGNOSIS — E079 Disorder of thyroid, unspecified: Secondary | ICD-10-CM | POA: Diagnosis not present

## 2017-07-16 DIAGNOSIS — E669 Obesity, unspecified: Secondary | ICD-10-CM | POA: Insufficient documentation

## 2017-07-16 HISTORY — PX: TOTAL HIP ARTHROPLASTY: SHX124

## 2017-07-16 LAB — GLUCOSE, CAPILLARY: Glucose-Capillary: 114 mg/dL — ABNORMAL HIGH (ref 65–99)

## 2017-07-16 SURGERY — ARTHROPLASTY, HIP, TOTAL,POSTERIOR APPROACH
Anesthesia: Spinal | Laterality: Left

## 2017-07-16 MED ORDER — BUPIVACAINE HCL (PF) 0.5 % IJ SOLN
INTRAMUSCULAR | Status: DC | PRN
  Administered 2017-07-16: 3 mL

## 2017-07-16 MED ORDER — PHENOL 1.4 % MT LIQD
1.0000 | OROMUCOSAL | Status: DC | PRN
  Filled 2017-07-16: qty 177

## 2017-07-16 MED ORDER — GLYCOPYRROLATE 0.2 MG/ML IJ SOLN
INTRAMUSCULAR | Status: AC
  Filled 2017-07-16: qty 1

## 2017-07-16 MED ORDER — FENTANYL CITRATE (PF) 100 MCG/2ML IJ SOLN: 25.0000 ug | INTRAMUSCULAR | Status: DC | PRN

## 2017-07-16 MED ORDER — METOCLOPRAMIDE HCL 10 MG PO TABS
10.0000 mg | ORAL_TABLET | Freq: Three times a day (TID) | ORAL | Status: AC
  Administered 2017-07-16 – 2017-07-17 (×4): 10 mg via ORAL
  Filled 2017-07-16 (×4): qty 1

## 2017-07-16 MED ORDER — ACETAMINOPHEN 10 MG/ML IV SOLN
1000.0000 mg | Freq: Four times a day (QID) | INTRAVENOUS | Status: AC
  Administered 2017-07-16 – 2017-07-17 (×3): 1000 mg via INTRAVENOUS
  Filled 2017-07-16 (×4): qty 100

## 2017-07-16 MED ORDER — PROPOFOL 500 MG/50ML IV EMUL
INTRAVENOUS | Status: DC | PRN
  Administered 2017-07-16: 50 ug/kg/min via INTRAVENOUS

## 2017-07-16 MED ORDER — ENOXAPARIN SODIUM 30 MG/0.3ML ~~LOC~~ SOLN
30.0000 mg | Freq: Two times a day (BID) | SUBCUTANEOUS | Status: DC
  Administered 2017-07-17 – 2017-07-18 (×3): 30 mg via SUBCUTANEOUS
  Filled 2017-07-16 (×4): qty 0.3

## 2017-07-16 MED ORDER — CEFAZOLIN SODIUM-DEXTROSE 2-4 GM/100ML-% IV SOLN
2.0000 g | Freq: Four times a day (QID) | INTRAVENOUS | Status: AC
  Administered 2017-07-16 – 2017-07-17 (×4): 2 g via INTRAVENOUS
  Filled 2017-07-16 (×4): qty 100

## 2017-07-16 MED ORDER — CEFAZOLIN SODIUM-DEXTROSE 2-3 GM-%(50ML) IV SOLR
INTRAVENOUS | Status: AC
  Filled 2017-07-16: qty 50

## 2017-07-16 MED ORDER — SODIUM CHLORIDE 0.9 % IV SOLN
INTRAVENOUS | Status: DC
Start: 2017-07-16 — End: 2017-07-18
  Administered 2017-07-16 (×2): via INTRAVENOUS

## 2017-07-16 MED ORDER — CELECOXIB 200 MG PO CAPS
400.0000 mg | ORAL_CAPSULE | Freq: Once | ORAL | Status: AC
Start: 2017-07-16 — End: 2017-07-16
  Administered 2017-07-16: 400 mg via ORAL

## 2017-07-16 MED ORDER — ALUM & MAG HYDROXIDE-SIMETH 200-200-20 MG/5ML PO SUSP: 30.0000 mL | ORAL | Status: DC | PRN

## 2017-07-16 MED ORDER — PANTOPRAZOLE SODIUM 40 MG PO TBEC
40.0000 mg | DELAYED_RELEASE_TABLET | Freq: Two times a day (BID) | ORAL | Status: DC
  Administered 2017-07-16 – 2017-07-18 (×5): 40 mg via ORAL
  Filled 2017-07-16 (×5): qty 1

## 2017-07-16 MED ORDER — LEVOTHYROXINE SODIUM 50 MCG PO TABS
150.0000 ug | ORAL_TABLET | Freq: Every day | ORAL | Status: DC
  Administered 2017-07-17 – 2017-07-18 (×2): 150 ug via ORAL
  Filled 2017-07-16 (×2): qty 1

## 2017-07-16 MED ORDER — DIPHENHYDRAMINE HCL 12.5 MG/5ML PO ELIX: 12.5000 mg | ORAL_SOLUTION | ORAL | Status: DC | PRN

## 2017-07-16 MED ORDER — CHLORHEXIDINE GLUCONATE 4 % EX LIQD: 60.0000 mL | Freq: Once | CUTANEOUS | Status: DC

## 2017-07-16 MED ORDER — BUPIVACAINE HCL (PF) 0.5 % IJ SOLN
INTRAMUSCULAR | Status: AC
  Filled 2017-07-16: qty 10

## 2017-07-16 MED ORDER — CELECOXIB 200 MG PO CAPS
200.0000 mg | ORAL_CAPSULE | Freq: Two times a day (BID) | ORAL | Status: DC
Start: 2017-07-16 — End: 2017-07-18
  Administered 2017-07-16 – 2017-07-17 (×4): 200 mg via ORAL
  Filled 2017-07-16 (×4): qty 1

## 2017-07-16 MED ORDER — MAGNESIUM HYDROXIDE 400 MG/5ML PO SUSP
30.0000 mL | Freq: Every day | ORAL | Status: DC | PRN
  Administered 2017-07-17: 30 mL via ORAL
  Filled 2017-07-16: qty 30

## 2017-07-16 MED ORDER — PROPOFOL 500 MG/50ML IV EMUL
INTRAVENOUS | Status: AC
  Filled 2017-07-16: qty 50

## 2017-07-16 MED ORDER — FLEET ENEMA 7-19 GM/118ML RE ENEM: 1.0000 | ENEMA | Freq: Once | RECTAL | Status: DC | PRN

## 2017-07-16 MED ORDER — GABAPENTIN 300 MG PO CAPS
ORAL_CAPSULE | ORAL | Status: AC
  Administered 2017-07-16: 300 mg via ORAL
  Filled 2017-07-16: qty 1

## 2017-07-16 MED ORDER — SODIUM CHLORIDE 0.9 % IV SOLN
INTRAVENOUS | Status: DC
  Administered 2017-07-16 (×2): via INTRAVENOUS

## 2017-07-16 MED ORDER — ACETAMINOPHEN 10 MG/ML IV SOLN
INTRAVENOUS | Status: AC
  Filled 2017-07-16: qty 100

## 2017-07-16 MED ORDER — OXYCODONE HCL 5 MG PO TABS
5.0000 mg | ORAL_TABLET | ORAL | Status: DC | PRN
  Administered 2017-07-16 – 2017-07-17 (×3): 5 mg via ORAL
  Filled 2017-07-16 (×3): qty 1

## 2017-07-16 MED ORDER — FENTANYL CITRATE (PF) 100 MCG/2ML IJ SOLN
INTRAMUSCULAR | Status: DC | PRN
  Administered 2017-07-16: 100 ug via INTRAVENOUS

## 2017-07-16 MED ORDER — ONDANSETRON HCL 4 MG PO TABS: 4.0000 mg | ORAL_TABLET | Freq: Four times a day (QID) | ORAL | Status: DC | PRN

## 2017-07-16 MED ORDER — OXYCODONE HCL 5 MG PO TABS
10.0000 mg | ORAL_TABLET | ORAL | Status: DC | PRN
  Administered 2017-07-17 – 2017-07-18 (×5): 10 mg via ORAL
  Filled 2017-07-16 (×5): qty 2

## 2017-07-16 MED ORDER — CELECOXIB 200 MG PO CAPS
ORAL_CAPSULE | ORAL | Status: AC
  Administered 2017-07-16: 400 mg via ORAL
  Filled 2017-07-16: qty 2

## 2017-07-16 MED ORDER — PHENYLEPHRINE HCL 10 MG/ML IJ SOLN
INTRAMUSCULAR | Status: AC
  Filled 2017-07-16: qty 1

## 2017-07-16 MED ORDER — HYDROMORPHONE HCL 1 MG/ML IJ SOLN: 0.5000 mg | INTRAMUSCULAR | Status: DC | PRN

## 2017-07-16 MED ORDER — TRAMADOL HCL 50 MG PO TABS
50.0000 mg | ORAL_TABLET | ORAL | Status: DC | PRN
Start: 2017-07-16 — End: 2017-07-18
  Administered 2017-07-16: 100 mg via ORAL
  Filled 2017-07-16: qty 2

## 2017-07-16 MED ORDER — LISINOPRIL 20 MG PO TABS
40.0000 mg | ORAL_TABLET | Freq: Every day | ORAL | Status: DC
  Administered 2017-07-16 – 2017-07-18 (×2): 40 mg via ORAL
  Filled 2017-07-16 (×2): qty 2

## 2017-07-16 MED ORDER — NEOMYCIN-POLYMYXIN B GU 40-200000 IR SOLN
Status: DC | PRN
  Administered 2017-07-16: 14 mL

## 2017-07-16 MED ORDER — FERROUS SULFATE 325 (65 FE) MG PO TABS
325.0000 mg | ORAL_TABLET | Freq: Two times a day (BID) | ORAL | Status: DC
  Administered 2017-07-16 – 2017-07-18 (×3): 325 mg via ORAL
  Filled 2017-07-16 (×3): qty 1

## 2017-07-16 MED ORDER — GABAPENTIN 300 MG PO CAPS
300.0000 mg | ORAL_CAPSULE | Freq: Once | ORAL | Status: AC
  Administered 2017-07-16: 300 mg via ORAL

## 2017-07-16 MED ORDER — SODIUM CHLORIDE 0.9 % IV SOLN
INTRAVENOUS | Status: DC | PRN
  Administered 2017-07-16: 30 ug/min via INTRAVENOUS

## 2017-07-16 MED ORDER — BISACODYL 10 MG RE SUPP: 10.0000 mg | Freq: Every day | RECTAL | Status: DC | PRN

## 2017-07-16 MED ORDER — LIDOCAINE HCL (PF) 2 % IJ SOLN
INTRAMUSCULAR | Status: AC
  Filled 2017-07-16: qty 10

## 2017-07-16 MED ORDER — TRANEXAMIC ACID 1000 MG/10ML IV SOLN
1000.0000 mg | Freq: Once | INTRAVENOUS | Status: AC
  Administered 2017-07-16: 1000 mg via INTRAVENOUS
  Filled 2017-07-16: qty 10

## 2017-07-16 MED ORDER — PROPOFOL 10 MG/ML IV BOLUS
INTRAVENOUS | Status: DC | PRN
  Administered 2017-07-16 (×3): 20 mg via INTRAVENOUS

## 2017-07-16 MED ORDER — DEXAMETHASONE SODIUM PHOSPHATE 10 MG/ML IJ SOLN
8.0000 mg | Freq: Once | INTRAMUSCULAR | Status: AC
  Administered 2017-07-16: 8 mg via INTRAVENOUS

## 2017-07-16 MED ORDER — FENTANYL CITRATE (PF) 100 MCG/2ML IJ SOLN
INTRAMUSCULAR | Status: AC
  Filled 2017-07-16: qty 2

## 2017-07-16 MED ORDER — MENTHOL 3 MG MT LOZG
1.0000 | LOZENGE | OROMUCOSAL | Status: DC | PRN
  Filled 2017-07-16: qty 9

## 2017-07-16 MED ORDER — ACETAMINOPHEN 325 MG PO TABS: 325.0000 mg | ORAL_TABLET | Freq: Four times a day (QID) | ORAL | Status: DC | PRN

## 2017-07-16 MED ORDER — METOCLOPRAMIDE HCL 5 MG/ML IJ SOLN: 5.0000 mg | Freq: Three times a day (TID) | INTRAMUSCULAR | Status: DC | PRN

## 2017-07-16 MED ORDER — ACETAMINOPHEN 10 MG/ML IV SOLN
INTRAVENOUS | Status: DC | PRN
  Administered 2017-07-16: 1000 mg via INTRAVENOUS

## 2017-07-16 MED ORDER — ONDANSETRON HCL 4 MG/2ML IJ SOLN: 4.0000 mg | Freq: Four times a day (QID) | INTRAMUSCULAR | Status: DC | PRN

## 2017-07-16 MED ORDER — SENNOSIDES-DOCUSATE SODIUM 8.6-50 MG PO TABS
1.0000 | ORAL_TABLET | Freq: Two times a day (BID) | ORAL | Status: DC
Start: 2017-07-16 — End: 2017-07-18
  Administered 2017-07-16 – 2017-07-18 (×5): 1 via ORAL
  Filled 2017-07-16 (×5): qty 1

## 2017-07-16 MED ORDER — GABAPENTIN 300 MG PO CAPS
300.0000 mg | ORAL_CAPSULE | Freq: Every day | ORAL | Status: DC
  Administered 2017-07-16 – 2017-07-17 (×2): 300 mg via ORAL
  Filled 2017-07-16 (×2): qty 1

## 2017-07-16 MED ORDER — DEXAMETHASONE SODIUM PHOSPHATE 10 MG/ML IJ SOLN
INTRAMUSCULAR | Status: AC
  Administered 2017-07-16: 8 mg via INTRAVENOUS
  Filled 2017-07-16: qty 1

## 2017-07-16 MED ORDER — METOCLOPRAMIDE HCL 10 MG PO TABS: 5.0000 mg | ORAL_TABLET | Freq: Three times a day (TID) | ORAL | Status: DC | PRN

## 2017-07-16 MED ORDER — ONDANSETRON HCL 4 MG/2ML IJ SOLN: 4.0000 mg | Freq: Once | INTRAMUSCULAR | Status: DC | PRN

## 2017-07-16 SURGICAL SUPPLY — 54 items
BLADE DRUM FLTD (BLADE) ×3 IMPLANT
BLADE SAW 1 (BLADE) ×3 IMPLANT
CANISTER SUCT 1200ML W/VALVE (MISCELLANEOUS) ×3 IMPLANT
CANISTER SUCT 3000ML PPV (MISCELLANEOUS) ×6 IMPLANT
CAPT HIP TOTAL 2 ×3 IMPLANT
CARTRIDGE OIL MAESTRO DRILL (MISCELLANEOUS) ×1 IMPLANT
CATH COUDE FOLEY 2W 5CC 16FR (CATHETERS) ×3 IMPLANT
DIFFUSER DRILL AIR PNEUMATIC (MISCELLANEOUS) ×3 IMPLANT
DRAPE INCISE IOBAN 66X60 STRL (DRAPES) ×3 IMPLANT
DRAPE SHEET LG 3/4 BI-LAMINATE (DRAPES) ×3 IMPLANT
DRSG DERMACEA 8X12 NADH (GAUZE/BANDAGES/DRESSINGS) ×3 IMPLANT
DRSG OPSITE POSTOP 4X12 (GAUZE/BANDAGES/DRESSINGS) ×3 IMPLANT
DRSG OPSITE POSTOP 4X14 (GAUZE/BANDAGES/DRESSINGS) IMPLANT
DRSG TEGADERM 4X4.75 (GAUZE/BANDAGES/DRESSINGS) ×3 IMPLANT
DURAPREP 26ML APPLICATOR (WOUND CARE) ×6 IMPLANT
ELECT BLADE 6.5 EXT (BLADE) ×3 IMPLANT
ELECT CAUTERY BLADE 6.4 (BLADE) ×3 IMPLANT
GLOVE BIOGEL M STRL SZ7.5 (GLOVE) ×12 IMPLANT
GLOVE BIOGEL PI IND STRL 9 (GLOVE) ×4 IMPLANT
GLOVE BIOGEL PI INDICATOR 9 (GLOVE) ×8
GLOVE INDICATOR 8.0 STRL GRN (GLOVE) ×9 IMPLANT
GLOVE SURG SYN 9.0  PF PI (GLOVE) ×2
GLOVE SURG SYN 9.0 PF PI (GLOVE) ×1 IMPLANT
GOWN STRL REUS W/ TWL LRG LVL3 (GOWN DISPOSABLE) ×2 IMPLANT
GOWN STRL REUS W/TWL 2XL LVL3 (GOWN DISPOSABLE) ×3 IMPLANT
GOWN STRL REUS W/TWL LRG LVL3 (GOWN DISPOSABLE) ×4
HEMOVAC 400CC 10FR (MISCELLANEOUS) ×3 IMPLANT
HOLDER FOLEY CATH W/STRAP (MISCELLANEOUS) ×3 IMPLANT
HOOD PEEL AWAY FLYTE STAYCOOL (MISCELLANEOUS) ×6 IMPLANT
KIT TURNOVER KIT A (KITS) ×3 IMPLANT
NDL SAFETY ECLIPSE 18X1.5 (NEEDLE) ×1 IMPLANT
NEEDLE HYPO 18GX1.5 SHARP (NEEDLE) ×2
NS IRRIG 500ML POUR BTL (IV SOLUTION) ×3 IMPLANT
OIL CARTRIDGE MAESTRO DRILL (MISCELLANEOUS) ×3
PACK HIP PROSTHESIS (MISCELLANEOUS) ×3 IMPLANT
PIN STEIN THRED 5/32 (Pin) ×3 IMPLANT
PULSAVAC PLUS IRRIG FAN TIP (DISPOSABLE) ×3
SOL .9 NS 3000ML IRR  AL (IV SOLUTION) ×2
SOL .9 NS 3000ML IRR UROMATIC (IV SOLUTION) ×1 IMPLANT
SOL PREP PVP 2OZ (MISCELLANEOUS) ×3
SOLUTION PREP PVP 2OZ (MISCELLANEOUS) ×1 IMPLANT
SPONGE DRAIN TRACH 4X4 STRL 2S (GAUZE/BANDAGES/DRESSINGS) ×3 IMPLANT
STAPLER SKIN PROX 35W (STAPLE) ×3 IMPLANT
SUT ETHIBOND #5 BRAIDED 30INL (SUTURE) ×3 IMPLANT
SUT VIC AB 0 CT1 36 (SUTURE) ×3 IMPLANT
SUT VIC AB 1 CT1 36 (SUTURE) ×6 IMPLANT
SUT VIC AB 2-0 CT1 27 (SUTURE) ×2
SUT VIC AB 2-0 CT1 TAPERPNT 27 (SUTURE) ×1 IMPLANT
SYR 20CC LL (SYRINGE) ×3 IMPLANT
TAPE ADH 3 LX (MISCELLANEOUS) ×3 IMPLANT
TAPE TRANSPORE STRL 2 31045 (GAUZE/BANDAGES/DRESSINGS) ×3 IMPLANT
TIP FAN IRRIG PULSAVAC PLUS (DISPOSABLE) ×1 IMPLANT
TOWEL OR 17X26 4PK STRL BLUE (TOWEL DISPOSABLE) ×3 IMPLANT
TRAY FOLEY W/METER SILVER 16FR (SET/KITS/TRAYS/PACK) ×3 IMPLANT

## 2017-07-16 NOTE — Op Note (Signed)
OPERATIVE NOTE  DATE OF SURGERY:  07/16/2017  PATIENT NAME:  Lucas Melendez   DOB: 1930/07/04  MRN: [id number]  PRE-OPERATIVE DIAGNOSIS: Degenerative arthrosis of the left hip, primary  POST-OPERATIVE DIAGNOSIS:  Same  PROCEDURE:  Left total hip arthroplasty  SURGEON:  Jena GaussJames P Teylor Wolven, Jr. M.D.  ASSISTANT:  Van ClinesJon Wolfe, PA (present and scrubbed throughout the case, critical for assistance with exposure, retraction, instrumentation, and closure)  ANESTHESIA: spinal  ESTIMATED BLOOD LOSS: 200 mL  FLUIDS REPLACED: 1000 mL of crystalloid  DRAINS: 2 medium drains to a Hemovac reservoir  IMPLANTS UTILIZED: DePuy 18 mm large stature AML femoral stem, 56 mm OD Pinnacle 100 acetabular component, +4 mm 10 degree Pinnacle Marathon polyethylene insert, and a 36 mm M-SPEC +1.5 mm hip ball  INDICATIONS FOR SURGERY: Lucas Melendez is a 84 y.o. year old male with a long history of progressive hip and groin  pain. X-rays demonstrated severe degenerative changes. The patient had not seen any significant improvement despite conservative nonsurgical intervention. After discussion of the risks and benefits of surgical intervention, the patient expressed understanding of the risks benefits and agree with plans for total hip arthroplasty.   The risks, benefits, and alternatives were discussed at length including but not limited to the risks of infection, bleeding, nerve injury, stiffness, blood clots, the need for revision surgery, limb length inequality, dislocation, cardiopulmonary complications, among others, and they were willing to proceed.  PROCEDURE IN DETAIL: The patient was brought into the operating room and, after adequate spinal anesthesia was achieved, the patient was placed in a right lateral decubitus position. Axillary roll was placed and all bony prominences were well-padded. The patient's left hip was cleaned and prepped with alcohol and DuraPrep and draped in the usual sterile fashion. A  "timeout" was performed as per usual protocol. A lateral curvilinear incision was made gently curving towards the posterior superior iliac spine. The IT band was incised in line with the skin incision and the fibers of the gluteus maximus were split in line. The piriformis tendon was identified, skeletonized, and incised at its insertion to the proximal femur and reflected posteriorly. A T type posterior capsulotomy was performed. Prior to dislocation of the femoral head, a threaded Steinmann pin was inserted through a separate stab incision into the pelvis superior to the acetabulum and bent in the form of a stylus so as to assess limb length and hip offset throughout the procedure. The femoral head was then dislocated posteriorly. Inspection of the femoral head demonstrated severe degenerative changes with full-thickness loss of articular cartilage. The femoral neck cut was performed using an oscillating saw. The anterior capsule was elevated off of the femoral neck using a periosteal elevator. Attention was then directed to the acetabulum. The remnant of the labrum was excised using electrocautery. Inspection of the acetabulum also demonstrated significant degenerative changes. The acetabulum was reamed in sequential fashion up to a 55 mm diameter. Good punctate bleeding bone was encountered. A 56 mm Pinnacle 100 acetabular component was positioned and impacted into place. Good scratch fit was appreciated. A +4 mm neutral polyethylene trial was inserted.  Attention was then directed to the proximal femur. A hole for reaming of the proximal femoral canal was created using a high-speed burr. The femoral canal was reamed in sequential fashion up to a 17.5 mm diameter. This allowed for approximately 6 cm of scratch fit.  It was elected to ream up to an 18 mm diameter to allow for a line to  line fit.  Serial broaches were inserted up to a 18 mm large stature femoral broach. Calcar region was planed and a trial  reduction was performed using a 36 mm hip ball with a +1.5 mm neck length.  Reasonably good stability was appreciated but trials with the +4 mm 10 degree liner with high side at the 5 o'clock position provided that her posterior stability.  Good equalization of limb lengths and hip offset was appreciated and excellent stability was noted both anteriorly and posteriorly. Trial components were removed. The acetabular shell was irrigated with copious amounts of normal saline with antibiotic solution and suctioned dry. A +4 mm 10 degree Pinnacle Marathon polyethylene insert was positioned and impacted into place with the high side at the 5 o'clock position. Next, a 18 mm large stature AML femoral stem was positioned and impacted into place. Excellent scratch fit was appreciated. A trial reduction was again performed with a 36 mm hip ball with a +1.5 mm neck length. Again, good equalization of limb lengths was appreciated and excellent stability appreciated both anteriorly and posteriorly. The hip was then dislocated and the trial hip ball was removed. The Morse taper was cleaned and dried. A 36 mm M-SPEC hip ball with a +1.5 mm neck length was placed on the trunnion and impacted into place. The hip was then reduced and placed through range of motion. Excellent stability was appreciated both anteriorly and posteriorly.  The wound was irrigated with copious amounts of normal saline with antibiotic solution and suctioned dry. Good hemostasis was appreciated. The posterior capsulotomy was repaired using #5 Ethibond. Piriformis tendon was reapproximated to the undersurface of the gluteus medius tendon using #5 Ethibond. Two medium drains were placed in the wound bed and brought out through separate stab incisions to be attached to a Hemovac reservoir. The IT band was reapproximated using interrupted sutures of #1 Vicryl. Subcutaneous tissue was approximated using first #0 Vicryl followed by #2-0 Vicryl. The skin was closed  with skin staples.  The patient tolerated the procedure well and was transported to the recovery room in stable condition.   Jena Gauss., M.D.

## 2017-07-16 NOTE — NC FL2 (Addendum)
[place] MEDICAID FL2 LEVEL OF CARE SCREENING TOOL     IDENTIFICATION  Patient Name: Lucas Melendez Birthdate: [birth year] Sex: male Admission Date (Current Location): 07/16/2017  [place]   Facility and Address:  [facility], [address], [place], [place] [address]      Provider Number: [id number]  Attending Physician Name and Address:  Donato HeinzHooten, James P, MD  Relative Name and Phone Number:  Harlin Heysearl Henriques wife [phone]    Current Level of Care: Hospital Recommended Level of Care: Skilled Nursing Facility Prior Approval Number:    Date Approved/Denied:   PASRR Number:  [id number]  Discharge Plan: SNF    Current Diagnoses: Patient Active Problem List   Diagnosis Date Noted  . Obesity, unspecified 07/16/2017  . ILD (interstitial lung disease) (HCC) 07/16/2017  . Hypothyroidism, unspecified 07/16/2017  . Hypertension 07/16/2017  . Hyperlipidemia, unspecified 07/16/2017  . Diabetes mellitus type 2, uncomplicated (HCC) 07/16/2017  . Status post total replacement of hip 07/16/2017  . Primary osteoarthritis of left hip 06/15/2017  . Hip fracture requiring operative repair, right, closed, initial encounter (HCC) 03/08/2016    Orientation RESPIRATION BLADDER Height & Weight     Self, Time, Situation, Place  Normal Continent Weight: 220 lb (99.8 kg) Height:  6\' 1"  (185.4 cm)  BEHAVIORAL SYMPTOMS/MOOD NEUROLOGICAL BOWEL NUTRITION STATUS  (none) (none) Continent (Regular)  AMBULATORY STATUS COMMUNICATION OF NEEDS Skin   Extensive Assist Verbally Normal                       Personal Care Assistance Level of Assistance  Bathing, Feeding, Dressing Bathing Assistance: Limited assistance Feeding assistance: Independent Dressing Assistance: Limited assistance     Functional Limitations Info  (none)          SPECIAL CARE FACTORS FREQUENCY  PT (By licensed PT), OT (By licensed OT)     PT Frequency:  5 OT Frequency: 5            Contractures Contractures Info: Not present    Additional Factors Info  Code Status, Allergies Code Status Info: FULL CODE  Allergies Info: NKA           Current Medications (07/16/2017):  This is the current hospital active medication list Current Facility-Administered Medications  Medication Dose Route Frequency Provider Last Rate Last Dose  . 0.9 %  sodium chloride infusion   Intravenous Continuous Hooten, Illene LabradorJames P, MD 100 mL/hr at 07/16/17 1247    . acetaminophen (OFIRMEV) IV 1,000 mg  1,000 mg Intravenous Q6H Hooten, Illene LabradorJames P, MD 400 mL/hr at 07/16/17 1629 1,000 mg at 07/16/17 1629  . [START ON 07/17/2017] acetaminophen (TYLENOL) tablet 325-650 mg  325-650 mg Oral Q6H PRN Hooten, Illene LabradorJames P, MD      . alum & mag hydroxide-simeth (MAALOX/MYLANTA) 200-200-20 MG/5ML suspension 30 mL  30 mL Oral Q4H PRN Hooten, Illene LabradorJames P, MD      . bisacodyl (DULCOLAX) suppository 10 mg  10 mg Rectal Daily PRN Hooten, Illene LabradorJames P, MD      . ceFAZolin (ANCEF) 2-3 GM-%(50ML) IVPB SOLR           . ceFAZolin (ANCEF) IVPB 2g/100 mL premix  2 g Intravenous Q6H Hooten, Illene LabradorJames P, MD   Stopped at 07/16/17 1317  . celecoxib (CELEBREX) capsule 200 mg  200 mg Oral BID Donato HeinzHooten, James P, MD   200 mg at 07/16/17 1251  . diphenhydrAMINE (BENADRYL) 12.5 MG/5ML elixir 12.5-25 mg  12.5-25 mg Oral Q4H PRN Donato Heinz, MD      . Melene Muller ON 07/17/2017] enoxaparin (LOVENOX) injection 30 mg  30 mg Subcutaneous Q12H Hooten, Illene Labrador, MD      . ferrous sulfate tablet 325 mg  325 mg Oral BID WC Hooten, Illene Labrador, MD   325 mg at 07/16/17 1629  . gabapentin (NEURONTIN) capsule 300 mg  300 mg Oral QHS Hooten, Illene Labrador, MD      . HYDROmorphone (DILAUDID) injection 0.5-1 mg  0.5-1 mg Intravenous Q4H PRN Hooten, Illene Labrador, MD      . Melene Muller ON 07/17/2017] levothyroxine (SYNTHROID, LEVOTHROID) tablet 150 mcg  150 mcg Oral QAC breakfast Hooten, Illene Labrador, MD      . lisinopril (PRINIVIL,ZESTRIL) tablet 40 mg  40 mg Oral Daily  Hooten, Illene Labrador, MD      . magnesium hydroxide (MILK OF MAGNESIA) suspension 30 mL  30 mL Oral Daily PRN Hooten, Illene Labrador, MD      . menthol-cetylpyridinium (CEPACOL) lozenge 3 mg  1 lozenge Oral PRN Hooten, Illene Labrador, MD       Or  . phenol (CHLORASEPTIC) mouth spray 1 spray  1 spray Mouth/Throat PRN Hooten, Illene Labrador, MD      . metoCLOPramide (REGLAN) tablet 5-10 mg  5-10 mg Oral Q8H PRN Hooten, Illene Labrador, MD       Or  . metoCLOPramide (REGLAN) injection 5-10 mg  5-10 mg Intravenous Q8H PRN Hooten, Illene Labrador, MD      . metoCLOPramide (REGLAN) tablet 10 mg  10 mg Oral TID AC & HS Hooten, Illene Labrador, MD   10 mg at 07/16/17 1629  . ondansetron (ZOFRAN) tablet 4 mg  4 mg Oral Q6H PRN Hooten, Illene Labrador, MD       Or  . ondansetron (ZOFRAN) injection 4 mg  4 mg Intravenous Q6H PRN Hooten, Illene Labrador, MD      . oxyCODONE (Oxy IR/ROXICODONE) immediate release tablet 10 mg  10 mg Oral Q4H PRN Hooten, Illene Labrador, MD      . oxyCODONE (Oxy IR/ROXICODONE) immediate release tablet 5 mg  5 mg Oral Q4H PRN Hooten, Illene Labrador, MD   5 mg at 07/16/17 1403  . pantoprazole (PROTONIX) EC tablet 40 mg  40 mg Oral BID Donato Heinz, MD   40 mg at 07/16/17 1251  . senna-docusate (Senokot-S) tablet 1 tablet  1 tablet Oral BID Hooten, Illene Labrador, MD   1 tablet at 07/16/17 1629  . sodium phosphate (FLEET) 7-19 GM/118ML enema 1 enema  1 enema Rectal Once PRN Hooten, Illene Labrador, MD      . traMADol Janean Sark) tablet 50-100 mg  50-100 mg Oral Q4H PRN Hooten, Illene Labrador, MD         Discharge Medications: Please see discharge summary for a list of discharge medications.  Relevant Imaging Results:  Relevant Lab Results:   Additional Information SSN: [id number]  Ruthe Mannan, [place]

## 2017-07-16 NOTE — Anesthesia Procedure Notes (Signed)
Spinal  Patient location during procedure: OR Start time: 07/16/2017 7:20 AM End time: 07/16/2017 7:27 AM Staffing Performed: resident/CRNA  Preanesthetic Checklist Completed: patient identified, site marked, surgical consent, pre-op evaluation, timeout performed, IV checked, risks and benefits discussed and monitors and equipment checked Spinal Block Patient position: sitting Prep: ChloraPrep Patient monitoring: heart rate, continuous pulse ox, blood pressure and cardiac monitor Approach: midline Location: L4-5 Injection technique: single-shot Needle Needle type: Introducer and Pencan  Needle gauge: 24 G Needle length: 9 cm Additional Notes Negative paresthesia. Negative blood return. Positive free-flowing CSF. Expiration date of kit checked and confirmed. Patient tolerated procedure well, without complications.

## 2017-07-16 NOTE — Discharge Instructions (Signed)
Instructions after Total Hip Replacement ° ° °  Mahonri Seiden P. Galia Rahm, Jr., M.D.    ° Dept. of Orthopaedics & Sports Medicine ° [facility] ° [address] ° [place], [place]  [address] ° Phone: [phone]   Fax: [phone] ° °  °DIET: °• Drink plenty of non-alcoholic fluids. °• Resume your normal diet. Include foods high in fiber. ° °ACTIVITY:  °• You may use crutches or a walker with weight-bearing as tolerated, unless instructed otherwise. °• You may be weaned off of the walker or crutches by your Physical Therapist.  °• Do NOT reach below the level of your knees or cross your legs until allowed.    °• Continue doing gentle exercises. Exercising will reduce the pain and swelling, increase motion, and prevent muscle weakness.   °• Please continue to use the TED compression stockings for 6 weeks. You may remove the stockings at night, but should reapply them in the morning. °• Do not drive or operate any equipment until instructed. ° °WOUND CARE:  °• Continue to use ice packs periodically to reduce pain and swelling. °• Keep the incision clean and dry. °• You may bathe or shower after the staples are removed at the first office visit following surgery. ° °MEDICATIONS: °• You may resume your regular medications. °• Please take the pain medication as prescribed on the medication. °• Do not take pain medication on an empty stomach. °• You have been given a prescription for a blood thinner to prevent blood clots. Please take the medication as instructed. (NOTE: After completing a 2 week course of Lovenox, take one Enteric-coated aspirin once a day.) °• Pain medications and iron supplements can cause constipation. Use a stool softener (Senokot or Colace) on a daily basis and a laxative (dulcolax or miralax) as needed. °• Do not drive or drink alcoholic beverages when taking pain medications. ° °CALL THE OFFICE FOR: °• Temperature above 101 degrees °• Excessive bleeding or drainage on the dressing. °• Excessive  swelling, coldness, or paleness of the toes. °• Persistent nausea and vomiting. ° °FOLLOW-UP:  °• You should have an appointment to return to the office in 6 weeks after surgery. °• Arrangements have been made for continuation of Physical Therapy (either home therapy or outpatient therapy). °  °

## 2017-07-16 NOTE — Anesthesia Preprocedure Evaluation (Signed)
Anesthesia Evaluation  Patient identified by MRN, date of birth, ID band Patient awake    Reviewed: Allergy & Precautions, NPO status , Patient's Chart, lab work & pertinent test results  History of Anesthesia Complications Negative for: history of anesthetic complications  Airway Mallampati: II       Dental  (+) Partial Lower, Loose, Chipped   Pulmonary neg sleep apnea, neg COPD,           Cardiovascular hypertension, Pt. on medications (-) Past MI and (-) CHF (-) dysrhythmias + Valvular Problems/Murmurs (murmur, no tx)      Neuro/Psych neg Seizures    GI/Hepatic Neg liver ROS, GERD  Medicated and Controlled,  Endo/Other  diabetes (borderline)Hypothyroidism   Renal/GU negative Renal ROS     Musculoskeletal   Abdominal   Peds  Hematology   Anesthesia Other Findings   Reproductive/Obstetrics                             Anesthesia Physical Anesthesia Plan  ASA: III  Anesthesia Plan: Spinal   Post-op Pain Management:    Induction:   PONV Risk Score and Plan:   Airway Management Planned: Nasal Cannula  Additional Equipment:   Intra-op Plan:   Post-operative Plan:   Informed Consent: I have reviewed the patients History and Physical, chart, labs and discussed the procedure including the risks, benefits and alternatives for the proposed anesthesia with the patient or authorized representative who has indicated his/her understanding and acceptance.     Plan Discussed with:   Anesthesia Plan Comments:         Anesthesia Quick Evaluation

## 2017-07-16 NOTE — Evaluation (Signed)
Physical Therapy Evaluation Patient Details Name: Lucas Melendez MRN: [id number] DOB: 1930-11-04 Today's Date: 07/16/2017   History of Present Illness  Pt admitted for L THR.   Clinical Impression  Pt is a pleasant 84 year old male who was admitted for L THR. Pt performs bed mobility with min assist, transfers with cga, and ambulation with cga and RW. Educated on hip precautions prior to performance. Pt demonstrates deficits with strength/mobility/pain. Appears motivated to perform therapy. Would benefit from skilled PT to address above deficits and promote optimal return to PLOF. Recommend transition to HHPT upon discharge from acute hospitalization.       Follow Up Recommendations Home health PT    Equipment Recommendations  None recommended by PT    Recommendations for Other Services       Precautions / Restrictions Precautions Precautions: Fall;Posterior Hip Precaution Booklet Issued: No Restrictions Weight Bearing Restrictions: Yes LLE Weight Bearing: Weight bearing as tolerated      Mobility  Bed Mobility Overal bed mobility: Needs Assistance Bed Mobility: Supine to Sit     Supine to sit: Min assist     General bed mobility comments: needs cues for sequencing, follow commands well. Once seated at EOB, able to sit with upright posture  Transfers Overall transfer level: Needs assistance Equipment used: Rolling walker (2 wheeled) Transfers: Sit to/from Stand Sit to Stand: Min guard         General transfer comment: safe technique with upright posture and RW used  Ambulation/Gait Ambulation/Gait assistance: Min guard Ambulation Distance (Feet): 5 Feet Assistive device: Rolling walker (2 wheeled) Gait Pattern/deviations: Step-to pattern     General Gait Details: able to side step over to recliner with safe technique. Hesitant of mobility, however follows commands well. No increased pain noted  Stairs            Wheelchair Mobility    Modified  Rankin (Stroke Patients Only)       Balance Overall balance assessment: Needs assistance Sitting-balance support: Feet supported Sitting balance-Leahy Scale: Good     Standing balance support: Bilateral upper extremity supported Standing balance-Leahy Scale: Good                               Pertinent Vitals/Pain Pain Assessment: Faces Faces Pain Scale: Hurts a little bit Pain Location: L hip Pain Descriptors / Indicators: Operative site guarding Pain Intervention(s): Limited activity within patient's tolerance;Repositioned;Ice applied;Premedicated before session    Home Living Family/patient expects to be discharged to:: Private residence Living Arrangements: Spouse/significant other Available Help at Discharge: Family Type of Home: House Home Access: Stairs to enter Entrance Stairs-Rails: Can reach both Entrance Stairs-Number of Steps: 2 Home Layout: One level Home Equipment: Walker - 2 wheels;Cane - single point;Bedside commode      Prior Function Level of Independence: Independent with assistive device(s)         Comments: uses SPC for all mobility     Hand Dominance        Extremity/Trunk Assessment   Upper Extremity Assessment Upper Extremity Assessment: Overall WFL for tasks assessed    Lower Extremity Assessment Lower Extremity Assessment: Generalized weakness(L LE grossly 3/5; R LE grossly 4/5)       Communication   Communication: No difficulties  Cognition Arousal/Alertness: Awake/alert Behavior During Therapy: WFL for tasks assessed/performed Overall Cognitive Status: Within Functional Limits for tasks assessed  General Comments      Exercises Other Exercises Other Exercises: supine ther-ex on L LE including ankle pumps, quad sets, glut sets, and hip abd/add. ALl ther-ex performed x 10 reps with min assist. Also educated on post hip precautions.   Assessment/Plan     PT Assessment Patient needs continued PT services  PT Problem List Decreased strength;Decreased activity tolerance;Decreased balance;Decreased mobility;Pain       PT Treatment Interventions DME instruction;Gait training;Therapeutic exercise;Stair training;Balance training    PT Goals (Current goals can be found in the Care Plan section)  Acute Rehab PT Goals Patient Stated Goal: to go home PT Goal Formulation: With patient Time For Goal Achievement: 07/30/17 Potential to Achieve Goals: Good    Frequency BID   Barriers to discharge        Co-evaluation               AM-PAC PT "6 Clicks" Daily Activity  Outcome Measure Difficulty turning over in bed (including adjusting bedclothes, sheets and blankets)?: Unable Difficulty moving from lying on back to sitting on the side of the bed? : Unable Difficulty sitting down on and standing up from a chair with arms (e.g., wheelchair, bedside commode, etc,.)?: Unable Help needed moving to and from a bed to chair (including a wheelchair)?: A Little Help needed walking in hospital room?: A Lot Help needed climbing 3-5 steps with a railing? : A Lot 6 Click Score: 10    End of Session Equipment Utilized During Treatment: Gait belt Activity Tolerance: Patient tolerated treatment well Patient left: in chair;with chair alarm set;with SCD's reapplied Nurse Communication: Mobility status PT Visit Diagnosis: Muscle weakness (generalized) (M62.81);Difficulty in walking, not elsewhere classified (R26.2);Pain Pain - Right/Left: Left Pain - part of body: Hip    Time: [id number] PT Time Calculation (min) (ACUTE ONLY): 26 min   Charges:   PT Evaluation $PT Eval Low Complexity: 1 Low PT Treatments $Therapeutic Exercise: 8-22 mins   PT G Codes:        Elizabeth PalauStephanie Weltha Cathy, PT, DPT [phone]   Carroll Lingelbach 07/16/2017, 5:22 PM

## 2017-07-16 NOTE — Transfer of Care (Signed)
Immediate Anesthesia Transfer of Care Note  Patient: Lucas Melendez  Procedure(s) Performed: TOTAL HIP ARTHROPLASTY (Left )  Patient Location: PACU  Anesthesia Type:Spinal  Level of Consciousness: sedated  Airway & Oxygen Therapy: Patient Spontanous Breathing and Patient connected to nasal cannula oxygen  Post-op Assessment: Report given to RN and Post -op Vital signs reviewed and stable  Post vital signs: Reviewed and stable  Last Vitals:  Vitals Value Taken Time  BP    Temp    Pulse 58 07/16/2017 10:24 AM  Resp 12 07/16/2017 10:24 AM  SpO2 98 % 07/16/2017 10:24 AM  Vitals shown include unvalidated device data.  Last Pain:  Vitals:   07/16/17 0652  TempSrc: Tympanic  PainSc: 0-No pain      Patients Stated Pain Goal: 0 (07/16/17 [id number])  Complications: No apparent anesthesia complications

## 2017-07-16 NOTE — H&P (Signed)
The patient has been re-examined, and the chart reviewed, and there have been no interval changes to the documented history and physical.    The risks, benefits, and alternatives have been discussed at length. The patient expressed understanding of the risks benefits and agreed with plans for surgical intervention.  James P. Hooten, Jr. M.D.    

## 2017-07-16 NOTE — Anesthesia Post-op Follow-up Note (Signed)
Anesthesia QCDR form completed.        

## 2017-07-16 NOTE — Progress Notes (Signed)
Pt admitted to room 142 from PACU. Pt is A&Ox4. Honeycomb dressing intact to left hip, foley and hemovac in place. Pt educated on hip precautions and pain medication regimen.    [place]amie[place]udson, Latricia HeftKorie G

## 2017-07-17 NOTE — Care Management Note (Signed)
Case Management Note  Patient Details  Name: JAKOREY MCCONATHY MRN: [id number] Date of Birth: [birth year]  Subjective/Objective:   POD # 1 left total hip arthroplasty. Met with patient as he is sitting up in chair. He is pleasant and cooperative. Patient lives at home with his wife. He has a walker and bsc. Offered list of home health agencies. Referral to [facility] for HHPT. [facility]acy: [phone]. Will check price of Lovenox prior to discharge.     Action/Plan: [facility] for HHPT, No DME needs.   Expected Discharge Date:                  Expected Discharge Plan:  [place]  In-House Referral:     Discharge planning Services  CM Consult  Post Acute Care Choice:  Home Health Choice offered to:  Patient  DME Arranged:    DME Agency:     HH Arranged:  PT [place]:  [facility] (formerly [facility])  Status of Service:  In process, will continue to follow  If discussed at Long Length of Stay Meetings, dates discussed:    Additional Comments:  Jolly Mango, RN 07/17/2017, 2:33 PM

## 2017-07-17 NOTE — Evaluation (Signed)
Occupational Therapy Evaluation Patient Details Name: Lucas Melendez MRN: [id number] DOB: 1930/11/24 Today's Date: 07/17/2017    History of Present Illness Pt admitted for L THR.    Clinical Impression   Pt seen for OT evaluation this date, POD#1 from above surgery. Pt was independent in all ADLs prior to surgery, however using SPC for all mobility. Pt is eager to return to PLOF with less pain and improved safety and independence. Pt currently requires min-mod assist for LB dressing and bathing while in seated position due to pain and limited AROM of L hip while posterior total hip precautions are in place. Pt instructed in posterior THPs and how to implement (pt able to teach back 3/3 precautions and how to implement by end of session with no VC), self care skills, falls prevention strategies, home/routines modifications, DME/AE for LB bathing and dressing tasks, and compression stocking mgt skills. Pt would benefit from additional instruction in self care skills and techniques to help maintain precautions with or without assistive devices to support recall and carryover prior to discharge. Do not anticipate any skilled OT needs upon return home with spouse assist.      Follow Up Recommendations  No OT follow up    Equipment Recommendations  Other (comment)(reacher)    Recommendations for Other Services       Precautions / Restrictions Precautions Precautions: Fall;Posterior Hip Precaution Booklet Issued: No Restrictions Weight Bearing Restrictions: Yes LLE Weight Bearing: Weight bearing as tolerated      Mobility Bed Mobility Overal bed mobility: Needs Assistance Bed Mobility: Supine to Sit     Supine to sit: Min guard;Min assist     General bed mobility comments: initial VC for hand placement  Transfers Overall transfer level: Needs assistance Equipment used: Rolling walker (2 wheeled) Transfers: Sit to/from Stand Sit to Stand: Min guard         General transfer  comment: good carryover of hand/foot placement to maximize safety    Balance Overall balance assessment: Needs assistance Sitting-balance support: Feet supported Sitting balance-Leahy Scale: Good     Standing balance support: Bilateral upper extremity supported Standing balance-Leahy Scale: Good                             ADL either performed or assessed with clinical judgement   ADL Overall ADL's : Needs assistance/impaired Eating/Feeding: Sitting;Independent   Grooming: Sitting;Independent   Upper Body Bathing: Sitting;Supervision/ safety;Set up   Lower Body Bathing: Sit to/from stand;Moderate assistance Lower Body Bathing Details (indicate cue type and reason): instructed in seated sponge bath techniques and seated shower once cleared while maintaining posterior THPs Upper Body Dressing : Sitting;Set up;Supervision/safety   Lower Body Dressing: Sit to/from stand;Moderate assistance Lower Body Dressing Details (indicate cue type and reason): instructed in AE for LB dressing and compression stocking mgt, all while maintaining posterior THPs Toilet Transfer: RW;Min guard;Ambulation;Comfort height toilet           Functional mobility during ADLs: Min guard;Rolling walker       Vision Baseline Vision/History: Wears glasses Wears Glasses: Reading only Patient Visual Report: No change from baseline       Perception     Praxis      Pertinent Vitals/Pain Pain Assessment: 0-10 Pain Score: 6  Pain Location: L hip Pain Descriptors / Indicators: Operative site guarding Pain Intervention(s): Limited activity within patient's tolerance;Monitored during session;Repositioned;Premedicated before session;Ice applied     Hand Dominance  Extremity/Trunk Assessment Upper Extremity Assessment Upper Extremity Assessment: Overall WFL for tasks assessed   Lower Extremity Assessment Lower Extremity Assessment: Generalized weakness;Defer to PT evaluation    Cervical / Trunk Assessment Cervical / Trunk Assessment: Normal   Communication Communication Communication: No difficulties   Cognition Arousal/Alertness: Awake/alert Behavior During Therapy: WFL for tasks assessed/performed Overall Cognitive Status: Within Functional Limits for tasks assessed                                     General Comments       Exercises Other Exercises Other Exercises: Pt instructed in posterior THPs to maximize recall and carryover including how to implement during mobility and ADL tasks. Pt able to teach back 3/3 precautions and how to implement by end of session with no VC   Shoulder Instructions      Home Living Family/patient expects to be discharged to:: Private residence Living Arrangements: Spouse/significant other Available Help at Discharge: Family Type of Home: House Home Access: Stairs to enter Secretary/administratorntrance Stairs-Number of Steps: 2 Entrance Stairs-Rails: Can reach both Home Layout: One level     Bathroom Shower/Tub: Tub/shower unit;Walk-in shower(has both but uses walkin)   FirefighterBathroom Toilet: Handicapped height(BSC frame overtop)     Home Equipment: Environmental consultantWalker - 2 wheels;Cane - single point;Bedside commode          Prior Functioning/Environment Level of Independence: Independent with assistive device(s)        Comments: uses SPC for all mobility, no falls in past 12 months, indep with all ADL, driving. Enjoys gardening with his spouse        OT Problem List: Decreased strength;Decreased knowledge of use of DME or AE;Decreased range of motion;Decreased knowledge of precautions;Pain      OT Treatment/Interventions: Self-care/ADL training;Balance training;Therapeutic exercise;Therapeutic activities;DME and/or AE instruction;Patient/family education    OT Goals(Current goals can be found in the care plan section) Acute Rehab OT Goals Patient Stated Goal: to go home OT Goal Formulation: With patient Time For Goal  Achievement: 07/31/17 Potential to Achieve Goals: Good ADL Goals Pt Will Perform Lower Body Dressing: with min assist;with adaptive equipment;sit to/from stand;with caregiver independent in assisting(either w/ AE or spouse indep assist, maintain post. THPs) Pt Will Transfer to Toilet: with supervision;ambulating(LRAD for amb, BSC over toilet) Additional ADL Goal #1: Pt will verbalize 3/3 posterior THPs and demonstrate techniques to maintain during functional mobility and ADL tasks 5/5 opportunities to maximize safety and adherence with recovery.  OT Frequency: Min 1X/week   Barriers to D/C:            Co-evaluation              AM-PAC PT "6 Clicks" Daily Activity     Outcome Measure Help from another person eating meals?: None Help from another person taking care of personal grooming?: None Help from another person toileting, which includes using toliet, bedpan, or urinal?: A Little Help from another person bathing (including washing, rinsing, drying)?: A Lot Help from another person to put on and taking off regular upper body clothing?: None Help from another person to put on and taking off regular lower body clothing?: A Lot 6 Click Score: 19   End of Session Equipment Utilized During Treatment: Gait belt;Rolling walker  Activity Tolerance: Patient tolerated treatment well Patient left: in chair;with call bell/phone within reach;with chair alarm set;with nursing/sitter in room;Other (comment)(ice pack on hip)  OT Visit  Diagnosis: Other abnormalities of gait and mobility (R26.89);Pain;Muscle weakness (generalized) (M62.81) Pain - Right/Left: Left Pain - part of body: Hip                Time: 1610-9604 OT Time Calculation (min): 23 min Charges:  OT General Charges $OT Visit: 1 Visit OT Evaluation $OT Eval Low Complexity: 1 Low OT Treatments $Self Care/Home Management : 8-22 mins  Richrd Prime, MPH, MS, OTR/L ascom [phone] 07/17/17, 11:26 AM

## 2017-07-17 NOTE — Progress Notes (Signed)
Clinical Social Worker (CSW) received SNF consult. PT is recommending home health. RN case manager aware of above. Please reconsult if future social work needs arise. CSW signing off.   Arnika Larzelere, LCSW [phone] 

## 2017-07-17 NOTE — Progress Notes (Signed)
Physical Therapy Treatment Patient Details Name: Lucas Melendez MRN: [id number] DOB: [birth year] Today's Date: 07/17/2017    History of Present Illness Pt admitted for L THR.     PT Comments    Pt is making good progress towards goals with increased distance noted this session. Safe technique with RW, with minimal reminders for hip precautions. Good endurance with there-ex and pt motivated to perform therapy. Will continue to progress.   Follow Up Recommendations  Home health PT     Equipment Recommendations  None recommended by PT    Recommendations for Other Services       Precautions / Restrictions Precautions Precautions: Fall;Posterior Hip Precaution Booklet Issued: No Restrictions Weight Bearing Restrictions: Yes LLE Weight Bearing: Weight bearing as tolerated    Mobility  Bed Mobility               General bed mobility comments: not performed as pt received in chair  Transfers Overall transfer level: Needs assistance Equipment used: Rolling walker (2 wheeled) Transfers: Sit to/from Stand Sit to Stand: Min guard         General transfer comment: safe technique with upright posture and RW used. RW adjusted to pt height  Ambulation/Gait Ambulation/Gait assistance: Min guard Ambulation Distance (Feet): 80 Feet Assistive device: Rolling walker (2 wheeled) Gait Pattern/deviations: Step-through pattern     General Gait Details: ambulated with reciprocal gait pattern with upright posture. Heel strike noted with symmetrical step length. Fatigues and request to return to room. Cues given for maintaining hip precautions   Stairs             Wheelchair Mobility    Modified Rankin (Stroke Patients Only)       Balance                                            Cognition Arousal/Alertness: Awake/alert Behavior During Therapy: WFL for tasks assessed/performed Overall Cognitive Status: Within Functional Limits for tasks  assessed                                        Exercises Other Exercises Other Exercises: supine ther-ex on L LE including ankle pumps, quad sets, glut sets, LAQ, and hip abd/add. ALl ther-ex performed x 12 reps with min assist. Also educated on post hip precautions.    General Comments        Pertinent Vitals/Pain Pain Assessment: 0-10 Pain Score: 6  Pain Location: L hip Pain Descriptors / Indicators: Operative site guarding Pain Intervention(s): Limited activity within patient's tolerance;Ice applied;Premedicated before session    Home Living                      Prior Function            PT Goals (current goals can now be found in the care plan section) Acute Rehab PT Goals Patient Stated Goal: to go home PT Goal Formulation: With patient Time For Goal Achievement: 07/30/17 Potential to Achieve Goals: Good Progress towards PT goals: Progressing toward goals    Frequency    BID      PT Plan Current plan remains appropriate    Co-evaluation              AM-PAC PT "6 Clicks"  Daily Activity  Outcome Measure  Difficulty turning over in bed (including adjusting bedclothes, sheets and blankets)?: Unable Difficulty moving from lying on back to sitting on the side of the bed? : Unable Difficulty sitting down on and standing up from a chair with arms (e.g., wheelchair, bedside commode, etc,.)?: Unable Help needed moving to and from a bed to chair (including a wheelchair)?: A Little Help needed walking in hospital room?: A Little Help needed climbing 3-5 steps with a railing? : A Little 6 Click Score: 12    End of Session Equipment Utilized During Treatment: Gait belt Activity Tolerance: Patient tolerated treatment well Patient left: in chair;with chair alarm set;with SCD's reapplied Nurse Communication: Mobility status PT Visit Diagnosis: Muscle weakness (generalized) (M62.81);Difficulty in walking, not elsewhere classified  (R26.2);Pain Pain - Right/Left: Left Pain - part of body: Hip     Time: 9604-5409 PT Time Calculation (min) (ACUTE ONLY): 23 min  Charges:  $Gait Training: 8-22 mins $Therapeutic Exercise: 8-22 mins                    G Codes:       Lucas Melendez, PT, DPT [phone]    Lucas Melendez 07/17/2017, 11:02 AM

## 2017-07-17 NOTE — Progress Notes (Signed)
Alert and oriented. Rested during the night with no complaints. dsg to left hip dry and intact with hemovac and ice pack in place. Pt up to commode once during the shift.. No acute distress noted. Will continue to monitor

## 2017-07-17 NOTE — Progress Notes (Signed)
Physical Therapy Treatment Patient Details Name: Lucas Melendez MRN: [id number] DOB: [birth year] Today's Date: 07/17/2017    History of Present Illness Pt admitted for L THR.     PT Comments    Pt agreeable to PT; reports 6/10 pain L hip. Requires Min guard for STS with good adherence to posterior hip precautions. Progressing ambulation distance; required education on avoiding toe in with turns during ambulation with good correction. Negotiated up/down 4 steps with good sequence understanding and performance. Pt returned to bed per pt request with Min A for LLE. Good upper body strength for repositioning upward in bed with use of trapeze and RLE. Continue PT to progress strength, endurance to improve all functional mobility for safe return home.    Follow Up Recommendations  Home health PT     Equipment Recommendations  None recommended by PT    Recommendations for Other Services       Precautions / Restrictions Precautions Precautions: Fall;Posterior Hip Precaution Comments: Remembers 3/3 hip precautions, but needs education to apply practically with ambulation turns Restrictions Weight Bearing Restrictions: Yes LLE Weight Bearing: Weight bearing as tolerated    Mobility  Bed Mobility Overal bed mobility: Needs Assistance Bed Mobility: Sit to Supine       Sit to supine: Min assist   General bed mobility comments: Good upper body use; use of trapeze to reposition upward in bed  Transfers Overall transfer level: Needs assistance Equipment used: Rolling walker (2 wheeled) Transfers: Sit to/from Stand Sit to Stand: Min guard         General transfer comment: Good adherence to posterior hip precautions  Ambulation/Gait Ambulation/Gait assistance: Min guard Ambulation Distance (Feet): 180 Feet Assistive device: Rolling walker (2 wheeled) Gait Pattern/deviations: Step-through pattern     General Gait Details: Lowered rw for improved height. Cues for avoiding toe in  with turns on ambulation; good correction with education   Stairs Stairs: Yes Stairs assistance: Min guard Stair Management: Two rails;Step to pattern;Forwards Number of Stairs: 4 General stair comments: Good understanding of sequence. Performs well   Wheelchair Mobility    Modified Rankin (Stroke Patients Only)       Balance Overall balance assessment: Needs assistance Sitting-balance support: Feet supported Sitting balance-Leahy Scale: Good     Standing balance support: Bilateral upper extremity supported Standing balance-Leahy Scale: Good                              Cognition Arousal/Alertness: Awake/alert Behavior During Therapy: WFL for tasks assessed/performed Overall Cognitive Status: Within Functional Limits for tasks assessed                                        Exercises      General Comments        Pertinent Vitals/Pain Pain Assessment: 0-10 Pain Score: 6  Pain Location: L hip Pain Descriptors / Indicators: Constant;Aching Pain Intervention(s): Monitored during session;Other (comment)(reports due for medication soon)    Home Living                      Prior Function            PT Goals (current goals can now be found in the care plan section) Progress towards PT goals: Progressing toward goals    Frequency    BID  PT Plan Current plan remains appropriate    Co-evaluation              AM-PAC PT "6 Clicks" Daily Activity  Outcome Measure  Difficulty turning over in bed (including adjusting bedclothes, sheets and blankets)?: Unable Difficulty moving from lying on back to sitting on the side of the bed? : Unable Difficulty sitting down on and standing up from a chair with arms (e.g., wheelchair, bedside commode, etc,.)?: Unable Help needed moving to and from a bed to chair (including a wheelchair)?: A Little Help needed walking in hospital room?: A Little Help needed climbing 3-5  steps with a railing? : A Little 6 Click Score: 12    End of Session Equipment Utilized During Treatment: Gait belt Activity Tolerance: Patient tolerated treatment well Patient left: in bed;with call bell/phone within reach;with bed alarm set;with SCD's reapplied   PT Visit Diagnosis: Muscle weakness (generalized) (M62.81);Difficulty in walking, not elsewhere classified (R26.2);Pain Pain - Right/Left: Left Pain - part of body: Hip     Time: 1435-1500 PT Time Calculation (min) (ACUTE ONLY): 25 min  Charges:  $Gait Training: 8-22 mins $Therapeutic Activity: 8-22 mins                    G Codes:        Scot Dock, PTA 07/17/2017, 3:30 PM

## 2017-07-17 NOTE — Progress Notes (Signed)
ORTHOPAEDICS PROGRESS NOTE  PATIENT NAME: Lucas Melendez DOB: September 02, 1930  MRN: [id number]  POD # 1: Left total hip arthroplasty  Subjective: The patient rested well last night.  He denies any nausea or vomiting.  Pain is been under good control.  Objective: Vital signs in last 24 hours: Temp:  [97.2 F (36.2 C)-98.1 F (36.7 C)] 98.1 F (36.7 C) (04/25 0410) Pulse Rate:  [54-71] 68 (04/25 0410) Resp:  [12-18] 18 (04/25 0014) BP: (107-154)/(51-84) 107/51 (04/25 0410) SpO2:  [95 %-100 %] 95 % (04/25 0410) FiO2 (%):  [21 %] 21 % (04/24 1131)  Intake/Output from previous day: 04/24 0701 - 04/25 0700 In: 3618.3 [P.O.:600; I.V.:2818.3; IV Piggyback:200] Out: 3445 [Urine:3075; Drains:170; Blood:200]  No results for input(s): WBC, HGB, HCT, PLT, K, CL, CO2, BUN, CREATININE, GLUCOSE, CALCIUM, LABPT, INR in the last 72 hours.  EXAM General: Well developed well-nourished male seen in no apparent discomfort. Lungs: clear to auscultation Cardiac: normal rate and regular rhythm.  2/6 to 3/6 murmur. Left lower extremity: Homans test is negative.  Dressing is clean and dry.  No significant ecchymosis, erythema, or swelling to the thigh. Neurologic: Awake, alert, and oriented.  Sensory and motor function are grossly intact.  Assessment: Left total hip arthroplasty  Secondary diagnoses: Hypertension Borderline diabetes Gastroesophageal reflux disease  Plan: Today's goals were reviewed with the patient.  Posterior hip precautions discussed. Begin physical therapy and Occupational Therapy as per total hip arthroplasty rehab protocol. Plan is to go Home after hospital stay. DVT Prophylaxis - Lovenox, Foot Pumps and TED hose  James P. Angie FavaHooten, Jr. M.D.

## 2017-07-17 NOTE — Anesthesia Postprocedure Evaluation (Signed)
Anesthesia Post Note  Patient: Penny PiaDelano A Wenzlick  Procedure(s) Performed: TOTAL HIP ARTHROPLASTY (Left )  Patient location during evaluation: Nursing Unit Anesthesia Type: Spinal Level of consciousness: oriented and awake and alert Pain management: pain level controlled Vital Signs Assessment: post-procedure vital signs reviewed and stable Respiratory status: spontaneous breathing, respiratory function stable and patient connected to nasal cannula oxygen Cardiovascular status: blood pressure returned to baseline and stable Postop Assessment: no headache, no backache and no apparent nausea or vomiting Anesthetic complications: no     Last Vitals:  Vitals:   07/17/17 0014 07/17/17 0410  BP: (!) 118/51 (!) 107/51  Pulse: 64 68  Resp: 18   Temp: 36.6 C 36.7 C  SpO2: 97% 95%    Last Pain:  Vitals:   07/17/17 0623  TempSrc:   PainSc: 6                  Jules SchickLogan,  Naresh Althaus P

## 2017-07-18 LAB — SURGICAL PATHOLOGY

## 2017-07-18 MED ORDER — CELECOXIB 200 MG PO CAPS: 200.0000 mg | ORAL_CAPSULE | Freq: Two times a day (BID) | ORAL | 1 refills | Status: AC

## 2017-07-18 MED ORDER — ENOXAPARIN SODIUM 40 MG/0.4ML ~~LOC~~ SOLN: 40.0000 mg | SUBCUTANEOUS | 0 refills | Status: AC

## 2017-07-18 MED ORDER — TRAMADOL HCL 50 MG PO TABS: 50.0000 mg | ORAL_TABLET | ORAL | 1 refills | Status: AC | PRN

## 2017-07-18 MED ORDER — OXYCODONE HCL 5 MG PO TABS: 5.0000 mg | ORAL_TABLET | ORAL | 0 refills | Status: AC | PRN

## 2017-07-18 NOTE — Progress Notes (Signed)
Physical Therapy Treatment Patient Details Name: Lucas Melendez MRN: [id number] DOB: May 17, 1930 Today's Date: 07/18/2017    History of Present Illness Pt admitted for L THR.     PT Comments    Pt has met goals for therapy and safe to dc home this date. Good endurance and ambulation around RN station with RW. Reciprocal gait pattern noted with minimal cues for upright posture. Performed stair training yesterday and feels comfortable to enter/exit home. Reviewed written HEP and pt able to state 3/3 hip precautions. Continues to be motivated to perform therapy.   Follow Up Recommendations  Home health PT     Equipment Recommendations  None recommended by PT    Recommendations for Other Services       Precautions / Restrictions Precautions Precautions: Fall;Posterior Hip Precaution Booklet Issued: Yes (comment) Precaution Comments: Remembers 3/3 hip precautions, but needs education to apply practically with ambulation turns Restrictions Weight Bearing Restrictions: Yes LLE Weight Bearing: Weight bearing as tolerated    Mobility  Bed Mobility Overal bed mobility: Needs Assistance Bed Mobility: Supine to Sit     Supine to sit: Min guard;Min assist     General bed mobility comments: follows commands well and able to tranfer to EOB with ease. Only slight assist moving L LE  Transfers Overall transfer level: Needs assistance Equipment used: Rolling walker (2 wheeled) Transfers: Sit to/from Stand Sit to Stand: Min guard         General transfer comment: slightly elevated bed to assist with transfer. Upright posture noted with RW  Ambulation/Gait Ambulation/Gait assistance: Min guard Ambulation Distance (Feet): 220 Feet Assistive device: Rolling walker (2 wheeled) Gait Pattern/deviations: Step-through pattern     General Gait Details: ambulated with reciprocal gait pattern and RW. Slight fatigue noted, however no standing rest breaks required. Cues to keep had up  during mobility as well as hip precautions with L turns   Stairs             Wheelchair Mobility    Modified Rankin (Stroke Patients Only)       Balance                                            Cognition Arousal/Alertness: Awake/alert Behavior During Therapy: WFL for tasks assessed/performed Overall Cognitive Status: Within Functional Limits for tasks assessed                                        Exercises Other Exercises Other Exercises: Supine ther-ex performed on L LE including ankle pumps, quad sets, heel slides, hip abd/add, LAQ, and glut sets. All ther-ex performed x 15 reps with cga. Safe technique performed.    General Comments        Pertinent Vitals/Pain Pain Assessment: 0-10 Pain Score: 7  Pain Location: L hip Pain Descriptors / Indicators: Constant;Aching Pain Intervention(s): Limited activity within patient's tolerance;Repositioned;Premedicated before session    Home Living                      Prior Function            PT Goals (current goals can now be found in the care plan section) Acute Rehab PT Goals Patient Stated Goal: to go home PT Goal Formulation: With patient  Time For Goal Achievement: 07/30/17 Potential to Achieve Goals: Good Progress towards PT goals: Progressing toward goals    Frequency    BID      PT Plan Current plan remains appropriate    Co-evaluation              AM-PAC PT "6 Clicks" Daily Activity  Outcome Measure  Difficulty turning over in bed (including adjusting bedclothes, sheets and blankets)?: Unable Difficulty moving from lying on back to sitting on the side of the bed? : Unable Difficulty sitting down on and standing up from a chair with arms (e.g., wheelchair, bedside commode, etc,.)?: Unable Help needed moving to and from a bed to chair (including a wheelchair)?: A Little Help needed walking in hospital room?: A Little Help needed climbing  3-5 steps with a railing? : A Little 6 Click Score: 12    End of Session Equipment Utilized During Treatment: Gait belt Activity Tolerance: Patient tolerated treatment well Patient left: in chair;with chair alarm set Nurse Communication: Mobility status PT Visit Diagnosis: Muscle weakness (generalized) (M62.81);Difficulty in walking, not elsewhere classified (R26.2);Pain Pain - Right/Left: Left Pain - part of body: Hip     Time: 8329-1916 PT Time Calculation (min) (ACUTE ONLY): 24 min  Charges:  $Gait Training: 8-22 mins $Therapeutic Exercise: 8-22 mins                    G Codes:       Greggory Stallion, PT, DPT [phone]    Lucas Melendez 07/18/2017, 11:08 AM

## 2017-07-18 NOTE — Progress Notes (Signed)
  Subjective: 2 Days Post-Op Procedure(s) (LRB): TOTAL HIP ARTHROPLASTY (Left) Patient reports pain as mild.   Patient seen in rounds with Dr. Ernest PineHooten. Patient is well, and has had no acute complaints or problems Plan is to go Home after hospital stay. Negative for chest pain and shortness of breath Fever: no Gastrointestinal: Negative for nausea and vomiting  Objective: Vital signs in last 24 hours: Temp:  [97.7 F (36.5 C)-98.7 F (37.1 C)] 98.7 F (37.1 C) (04/26 0117) Pulse Rate:  [67-84] 83 (04/26 0117) Resp:  [16] 16 (04/26 0117) BP: (106-150)/(41-81) 119/53 (04/26 0117) SpO2:  [93 %-99 %] 93 % (04/26 0117)  Intake/Output from previous day:  Intake/Output Summary (Last 24 hours) at 07/18/2017 0725 Last data filed at 07/18/2017 0631 Gross per 24 hour  Intake 1560 ml  Output 3400 ml  Net -1840 ml    Intake/Output this shift: No intake/output data recorded.  Labs: No results for input(s): HGB in the last 72 hours. No results for input(s): WBC, RBC, HCT, PLT in the last 72 hours. No results for input(s): NA, K, CL, CO2, BUN, CREATININE, GLUCOSE, CALCIUM in the last 72 hours. No results for input(s): LABPT, INR in the last 72 hours.   EXAM General - Patient is Alert and Oriented Extremity - Neurovascular intact Dorsiflexion/Plantar flexion intact No cellulitis present Compartment soft Dressing/Incision - clean, dry, no drainage, with the Hemovac removed Motor Function - intact, moving foot and toes well on exam.  Ambulated 180 feet with physical therapy  Past Medical History:  Diagnosis Date  . Acid reflux   . Borderline diabetes   . Hypertension   . Lower extremity edema   . Thyroid disease    hyper    Assessment/Plan: 2 Days Post-Op Procedure(s) (LRB): TOTAL HIP ARTHROPLASTY (Left) Active Problems:   Status post total replacement of hip  Estimated body mass index is 29.03 kg/m as calculated from the following:   Height as of this encounter: 6\' 1"   (1.854 m).   Weight as of this encounter: 99.8 kg (220 lb). Advance diet Up with therapy Discharge home with home health  DVT Prophylaxis - Lovenox, Foot Pumps and TED hose Weight-Bearing as tolerated to left leg  Dedra Skeensodd Fletcher Ostermiller, PA-C Orthopaedic Surgery 07/18/2017, 7:25 AM

## 2017-07-18 NOTE — Discharge Summary (Signed)
Physician Discharge Summary  Subjective: 2 Days Post-Op Procedure(s) (LRB): TOTAL HIP ARTHROPLASTY (Left) Patient reports pain as mild.   Patient seen in rounds with Dr. Ernest Pine. Patient is well, and has had no acute complaints or problems Patient is ready to go home with home health physical therapy  Physician Discharge Summary  Patient ID: Lucas Melendez MRN: [id number] DOB/AGE: 1930/05/05 84 y.o.  Admit date: 07/16/2017 Discharge date: 07/18/2017  Admission Diagnoses:  Discharge Diagnoses:  Active Problems:   Status post total replacement of hip   Discharged Condition: good  Hospital Course: The patient is postop day 2 from a left total hip replacement.  He has done very well since surgery.  He ambulated 180 feet with physical therapy yesterday.  His drain was removed this morning with no complication.  He has had a bowel movement.  His vitals are stable.  He is ready to go home with home health physical therapy.  Treatments: surgery:  Left total hip arthroplasty  SURGEON:  Jena Gauss. M.D.  ASSISTANT:  Van Clines, PA (present and scrubbed throughout the case, critical for assistance with exposure, retraction, instrumentation, and closure)  ANESTHESIA: spinal  ESTIMATED BLOOD LOSS: 200 mL  FLUIDS REPLACED: 1000 mL of crystalloid  DRAINS: 2 medium drains to a Hemovac reservoir  IMPLANTS UTILIZED: DePuy 18 mm large stature AML femoral stem, 56 mm OD Pinnacle 100 acetabular component, +4 mm 10 degree Pinnacle Marathon polyethylene insert, and a 36 mm M-SPEC +1.5 mm hip ball    Discharge Exam: Blood pressure (!) 119/53, pulse 83, temperature 98.7 F (37.1 C), resp. rate 16, height 6\' 1"  (1.854 m), weight 99.8 kg (220 lb), SpO2 93 %.   Disposition: Discharge disposition: 01-Home or Self Care        Allergies as of 07/18/2017   No Known Allergies     Medication List    STOP taking these medications   ibuprofen 200 MG tablet Commonly known as:   ADVIL,MOTRIN     TAKE these medications   acetaminophen 500 MG tablet Commonly known as:  TYLENOL Take 1,000 mg by mouth daily as needed for moderate pain or headache.   aspirin EC 81 MG tablet Take 81 mg by mouth daily.   celecoxib 200 MG capsule Commonly known as:  CELEBREX Take 1 capsule (200 mg total) by mouth 2 (two) times daily.   enoxaparin 40 MG/0.4ML injection Commonly known as:  LOVENOX Inject 0.4 mLs (40 mg total) into the skin daily.   levothyroxine 150 MCG tablet Commonly known as:  SYNTHROID, LEVOTHROID Take 150 mcg by mouth daily before breakfast.   lisinopril 40 MG tablet Commonly known as:  PRINIVIL,ZESTRIL Take 40 mg by mouth daily.   oxyCODONE 5 MG immediate release tablet Commonly known as:  Oxy IR/ROXICODONE Take 1 tablet (5 mg total) by mouth every 4 (four) hours as needed for moderate pain (pain score 4-6).   ranitidine 150 MG tablet Commonly known as:  ZANTAC Take 150 mg by mouth daily as needed for heartburn.   simvastatin 10 MG tablet Commonly known as:  ZOCOR Take 10 mg by mouth daily.   traMADol 50 MG tablet Commonly known as:  ULTRAM Take 1-2 tablets (50-100 mg total) by mouth every 4 (four) hours as needed for moderate pain.            Durable Medical Equipment  (From admission, onward)        Start     Ordered   07/16/17 1131  DME Walker rolling  Once    Question:  Patient needs a walker to treat with the following condition  Answer:  S/P total hip arthroplasty   07/16/17 1130   07/16/17 1131  DME Bedside commode  Once    Question:  Patient needs a bedside commode to treat with the following condition  Answer:  S/P total hip arthroplasty   07/16/17 1130     Follow-up Information    Donato HeinzHooten, James P, MD On 08/26/2017.   Specialty:  Orthopedic Surgery Why:  at 9:15am Contact information: [address] [facility] [place] [id number] [phone]           Signed: Lenard ForthMUNDY, Rachel Samples 07/18/2017, 7:29  AM   Objective: Vital signs in last 24 hours: Temp:  [97.7 F (36.5 C)-98.7 F (37.1 C)] 98.7 F (37.1 C) (04/26 0117) Pulse Rate:  [67-84] 83 (04/26 0117) Resp:  [16] 16 (04/26 0117) BP: (106-150)/(41-81) 119/53 (04/26 0117) SpO2:  [93 %-99 %] 93 % (04/26 0117)  Intake/Output from previous day:  Intake/Output Summary (Last 24 hours) at 07/18/2017 0729 Last data filed at 07/18/2017 0631 Gross per 24 hour  Intake 1560 ml  Output 3400 ml  Net -1840 ml    Intake/Output this shift: No intake/output data recorded.  Labs: No results for input(s): HGB in the last 72 hours. No results for input(s): WBC, RBC, HCT, PLT in the last 72 hours. No results for input(s): NA, K, CL, CO2, BUN, CREATININE, GLUCOSE, CALCIUM in the last 72 hours. No results for input(s): LABPT, INR in the last 72 hours.  EXAM: General - Patient is Alert and Oriented Extremity - Sensation intact distally Dorsiflexion/Plantar flexion intact No cellulitis present Compartment soft Incision - clean, dry, no drainage, with Hemovac removed Motor Function -dorsiflexion and plantarflexion are intact.  Ambulated 180 feet.  Assessment/Plan: 2 Days Post-Op Procedure(s) (LRB): TOTAL HIP ARTHROPLASTY (Left) Procedure(s) (LRB): TOTAL HIP ARTHROPLASTY (Left) Past Medical History:  Diagnosis Date  . Acid reflux   . Borderline diabetes   . Hypertension   . Lower extremity edema   . Thyroid disease    hyper   Active Problems:   Status post total replacement of hip  Estimated body mass index is 29.03 kg/m as calculated from the following:   Height as of this encounter: 6\' 1"  (1.854 m).   Weight as of this encounter: 99.8 kg (220 lb). Advance diet Discharge home with home health Diet - Regular diet Follow up - in 6 weeks Activity - WBAT Disposition - Home Condition Upon Discharge - Good DVT Prophylaxis - Lovenox  Dedra Skeensodd Amire Leazer, PA-C Orthopaedic Surgery 07/18/2017, 7:29 AM

## 2017-07-18 NOTE — Progress Notes (Signed)
Patient is being discharged home with wife. Discharge instructions given with wife and two daughters at bedside. IV removed with cath intact. Reviewed discharge instructions, meds, scripts, and last dose given. Extra dressing sent with patient.

## 2017-07-18 NOTE — Care Management (Signed)
Discharge to home today per Wendall Stadeodd Mundy PA-C, Telephone call to [facility]. Lovenox co-pay is $14.65.  Will update Mr. Ane PaymentMoran Mariame Rybolt S Samone Guhl RN MSN CCM Care Management [phone]

## 2017-07-20 DIAGNOSIS — E039 Hypothyroidism, unspecified: Secondary | ICD-10-CM | POA: Diagnosis not present

## 2017-07-20 DIAGNOSIS — K219 Gastro-esophageal reflux disease without esophagitis: Secondary | ICD-10-CM | POA: Diagnosis not present

## 2017-07-20 DIAGNOSIS — I10 Essential (primary) hypertension: Secondary | ICD-10-CM | POA: Diagnosis not present

## 2017-07-20 DIAGNOSIS — E785 Hyperlipidemia, unspecified: Secondary | ICD-10-CM | POA: Diagnosis not present

## 2017-07-20 DIAGNOSIS — Z471 Aftercare following joint replacement surgery: Secondary | ICD-10-CM | POA: Diagnosis not present

## 2017-07-20 DIAGNOSIS — E119 Type 2 diabetes mellitus without complications: Secondary | ICD-10-CM | POA: Diagnosis not present

## 2017-07-21 ENCOUNTER — Telehealth: Payer: Self-pay

## 2017-07-21 NOTE — Telephone Encounter (Signed)
Flagged on EMMI report for having questions about discharge papers.  Called and spoke to patient.  He mentioned his wife actually took the survey for him, though neither have questions about his discharge papers.  They have no further questions or concerns related to his discharge at this time.  I thanked him for his time and informed him that he would receive one more automated call in the next few days checking on him.

## 2017-07-25 ENCOUNTER — Telehealth: Payer: Self-pay

## 2017-07-25 NOTE — Telephone Encounter (Signed)
EMMI Follow-up: Report noted patient had other questions/problems.  Called and talked with patient as he wondered when he could take off compression stockings as legs were swelling. Advised him to call Dr. Ernest Pine for instructions.  Said everything else was going ahead of schedule with PT. Thanked me for following up.

## 2017-08-06 DIAGNOSIS — Z471 Aftercare following joint replacement surgery: Secondary | ICD-10-CM | POA: Diagnosis not present

## 2017-08-26 DIAGNOSIS — Z96642 Presence of left artificial hip joint: Secondary | ICD-10-CM | POA: Diagnosis not present

## 2017-09-01 ENCOUNTER — Other Ambulatory Visit: Payer: Self-pay

## 2017-09-01 ENCOUNTER — Ambulatory Visit: Payer: Medicare Other | Attending: Orthopedic Surgery

## 2017-09-01 DIAGNOSIS — R262 Difficulty in walking, not elsewhere classified: Secondary | ICD-10-CM

## 2017-09-01 DIAGNOSIS — M6281 Muscle weakness (generalized): Secondary | ICD-10-CM | POA: Diagnosis not present

## 2017-09-01 DIAGNOSIS — M25552 Pain in left hip: Secondary | ICD-10-CM | POA: Diagnosis not present

## 2017-09-01 NOTE — Therapy (Signed)
[facility] [address], [place], [address] Phone: [phone]   Fax:  [phone]  Physical Therapy Evaluation  Patient Details  Name: Lucas Melendez MRN: [id number] Date of Birth: 21-Sep-1930 Referring Provider: Francesco Sor, MD   Encounter Date: 09/01/2017  PT End of Session - 09/01/17 1052    Visit Number  1    Number of Visits  13    Date for PT Re-Evaluation  10/16/17    Authorization Type  1    Authorization Time Period  10    PT Start Time  1052    PT Stop Time  1144    PT Time Calculation (min)  52 min    Activity Tolerance  Patient tolerated treatment well    Behavior During Therapy  [facility] for tasks assessed/performed       Past Medical History:  Diagnosis Date  . Acid reflux   . Borderline diabetes   . Hypertension   . Lower extremity edema   . Thyroid disease    hyper    Past Surgical History:  Procedure Laterality Date  . BACK SURGERY    . CARPAL TUNNEL RELEASE    . CATARACT EXTRACTION    . COMPRESSION HIP SCREW Right 03/09/2016   Procedure: COMPRESSION HIP;  Surgeon: Bernita Raisin, DO;  Location: [facility] ORS;  Service: Orthopedics;  Laterality: Right;  . khyphoplasty    . TOTAL HIP ARTHROPLASTY Left 07/16/2017   Procedure: TOTAL HIP ARTHROPLASTY;  Surgeon: Donato Heinz, MD;  Location: [facility] ORS;  Service: Orthopedics;  Laterality: Left;    There were no vitals filed for this visit.   Subjective Assessment - 09/01/17 1056    Subjective  L hip pain. 0/10 currently, 1/10 at worst for the past 4 weeks. L anterior lateral thigh: 0/10 currently (just sore, especially when pt walks), 2/10 at worst L anterior lateral thigh for the past  4 weeks.     Pertinent History  S/P L THA on 07/16/2017, posterior approach. Pt states he feels pain mainly anterior lateral L thigh and across his low back area. Was told to have arthritis in his R hip. Has not noticed to low back pain in the last few  weeks, not an every day occurence.  Pt also said that the surgeon said that he can sleep on his L side and does not have to use the pillow between his knees.  Had home health PT after discharge from the hospital. Did supine hip abduction, seated knee extension, hip flexion, standing marches, squats, hip abduction.  L hip started bothering him last fall 2018 after working on his deck. Had bone on bone pain and started using his SPC. The bone on bone pain led to the surgery.       Patient Stated Goals  Walk better.     Currently in Pain?  No/denies    Pain Score  0-No pain    Pain Location  Hip    Pain Orientation  Left    Pain Descriptors / Indicators  Sore    Pain Type  Chronic pain    Pain Onset  More than a month ago    Pain Frequency  Occasional    Aggravating Factors   walking    Pain Relieving Factors  rest         [facility] PT Assessment - 09/01/17 1047      Assessment   Medical Diagnosis  L total hip  replacement    Referring Provider  Francesco Sor, MD    Onset Date/Surgical Date  07/16/17    Prior Therapy  Pt participated in both acute care and home health PT following his procedure      Precautions   Precaution Comments  Posterior hip precautions      Restrictions   Other Position/Activity Restrictions  WBAT      Balance Screen   Has the patient fallen in the past 6 months  No    Has the patient had a decrease in activity level because of a fear of falling?   No Pt careful of uneven ground    Is the patient reluctant to leave their home because of a fear of falling?   No      Home Environment   Additional Comments  Pt lives in a one story home. 2 steps at the deck with bilateral rail (back entrance). 4 steps front entrance, no rails (pt does not use the front door).       Prior Function   Vocation Requirements  PLOF: pt ambulating with SPC due to L hip pain      Observation/Other Assessments   Observations  Surgical incision healed well     Focus on Therapeutic Outcomes  (FOTO)   hip FOTO 58      Posture/Postural Control   Posture Comments  Slight R lateral shift, decreased lordosis, decreased bilateral hip extension R > L, R LE weight shifting       Strength   Right Hip Flexion  4/5    Right Hip Extension  4/5 Seated manually resisted leg press    Right Hip ABduction  4/5 seated manually resisted clamshell isometric    Left Hip Flexion  4+/5    Left Hip Extension  4/5 Seated manually resisted leg press    Left Hip ABduction  4-/5 seated manually resisted clamshell isometric    Right Knee Flexion  4+/5    Right Knee Extension  5/5    Left Knee Flexion  4/5    Left Knee Extension  4+/5    Right Ankle Dorsiflexion  4+/5    Left Ankle Dorsiflexion  4+/5      Palpation   Palpation comment  Good scar tissue mobility       Ambulation/Gait   Gait Comments  No AD, antalgic, decreased stance L LE with L lateral lean during stance phase (about 75 ft)                Objective measurements completed on examination: See above findings.   Incision posterior to L greater trochanter. Healed well. Good scar tissue mobility.    Therapeutic excerise  Reviewed posterior hip precautions. Pt needed reinforcing with the no bending past 90 degrees flexion and no twisting.   sit <> stand from elevated mat table without UE assist 5x3  Height 24.5 inches  Seated clamshells, hips less than 90 degrees flexion, resisting green band, (neutral thighs beginning and resting to maintain precautions) 10x2  Supine with L LE straight and R hip in hooklying position: glute max squeeze to promote hip extension strength and ROM. 10x3 with 5 second holds  Reviewed and given as part of his HEP. Pt can hold off the previous L hip flexior stretch with L LE hanging off his bed (pt states uncomfortable pull anterior L hip) and use this exercise instead.     Improved exercise technique, movement at target joints, use of target muscles after  mod verbal, visual, tactile cues.     Patient is an 84 year old male who came to physical therapy S/P L total hip replacement on 07/16/2017. He also presents with altered gait pattern and posture, L LE weakness, and difficulty performing functional tasks such as walking and stair negotiation. Patient will benefit from skilled physical therapy services to address the aforementioned deficits.    PT Education - 09/01/17 1236    Education provided  Yes    Education Details  ther-ex, HEP, plan of care    Person(s) Educated  Patient    Methods  Explanation;Demonstration;Tactile cues;Verbal cues;Handout    Comprehension  Returned demonstration;Verbalized understanding          PT Long Term Goals - 09/01/17 1244      PT LONG TERM GOAL #1   Title  Patient will be able to ambulate independently at least 500 ft to promote mobility.     Baseline  Currently ambulates with SPC, no AD at times short distances (09/01/2017)    Time  6    Period  Weeks    Status  New    Target Date  10/16/17      PT LONG TERM GOAL #2   Title  Patient will improve L LE hip and knee strength by at least 1/2 MMT grade to promote ability to ambulate, perform standing tasks.     Time  6    Period  Weeks    Status  New    Target Date  10/16/17      PT LONG TERM GOAL #3   Title  Pt will improve his hip FOTO score by at least 8 points as a demonstration of improved function.     Baseline  hip FOTO 58 (09/01/2017)    Time  6    Period  Weeks    Status  New    Target Date  10/16/17             Plan - 09/01/17 1234    Clinical Impression Statement  Patient is an 84 year old male who came to physical therapy S/P L total hip replacement on 07/16/2017. He also presents with altered gait pattern and posture, L LE weakness, and difficulty performing functional tasks such as walking and stair negotiation. Patient will benefit from skilled physical therapy services to address the aforementioned deficits.     History and Personal Factors relevant to plan  of care:  age, history of multiple surgeries, difficulty walking, stair negotiation    Clinical Presentation  Stable    Clinical Presentation due to:  Improving ability to ambulat since surgery based on notes    Clinical Decision Making  Low    Rehab Potential  Good    Clinical Impairments Affecting Rehab Potential  age, weakness     PT Frequency  2x / week    PT Duration  6 weeks    PT Treatment/Interventions  Therapeutic exercise;Therapeutic activities;Balance training;Patient/family education;Neuromuscular re-education;Manual techniques;Dry needling;Aquatic Therapy;Electrical Stimulation;Iontophoresis 4mg /ml Dexamethasone    PT Next Visit Plan  L LE strengthening, gait, manual techniques, modalities PRN    Consulted and Agree with Plan of Care  Patient       Patient will benefit from skilled therapeutic intervention in order to improve the following deficits and impairments:  Abnormal gait, Decreased strength, Improper body mechanics, Difficulty walking  Visit Diagnosis: Muscle weakness (generalized) - Plan: PT plan of care cert/re-cert  Difficulty in walking, not elsewhere classified - Plan: PT  plan of care cert/re-cert  Pain in left hip - Plan: PT plan of care cert/re-cert     Problem List Patient Active Problem List   Diagnosis Date Noted  . Obesity, unspecified 07/16/2017  . ILD (interstitial lung disease) (HCC) 07/16/2017  . Hypothyroidism, unspecified 07/16/2017  . Hypertension 07/16/2017  . Hyperlipidemia, unspecified 07/16/2017  . Diabetes mellitus type 2, uncomplicated (HCC) 07/16/2017  . Status post total replacement of hip 07/16/2017  . Primary osteoarthritis of left hip 06/15/2017  . Hip fracture requiring operative repair, right, closed, initial encounter [facility](HCC) 03/08/2016    Loralyn FreshwaterMiguel Rheta Hemmelgarn PT, DPT  09/01/2017, 7:38 PM  [place] [facility] [address]  [place], [place], [address] Phone: [phone]    Fax:  [phone]  Name: Lucas Melendez MRN: [id number] Date of Birth: 18-Aug-1930

## 2017-09-01 NOTE — Patient Instructions (Signed)
Lying on your back, right knee bent, left leg straight   Squeeze your rear end muscles together.   Hold for 5 seconds    Repeat 10 times   Perform 3 sets daily.

## 2017-09-08 ENCOUNTER — Ambulatory Visit: Payer: Medicare Other

## 2017-09-08 DIAGNOSIS — M6281 Muscle weakness (generalized): Secondary | ICD-10-CM

## 2017-09-08 DIAGNOSIS — M25552 Pain in left hip: Secondary | ICD-10-CM

## 2017-09-08 DIAGNOSIS — R262 Difficulty in walking, not elsewhere classified: Secondary | ICD-10-CM

## 2017-09-08 NOTE — Therapy (Signed)
[place] [facility] [address] [place], [address] Phone: [phone]   Fax:  [phone]  Physical Therapy Treatment  Patient Details  Name: Lucas Melendez MRN: [id number] Date of Birth: [birth year] Referring Provider: Francesco Sor, MD   Encounter Date: 09/08/2017  PT End of Session - 09/08/17 1033    Visit Number  2    Number of Visits  13    Date for PT Re-Evaluation  10/16/17    Authorization Type  2    Authorization Time Period  10    PT Start Time  1034    PT Stop Time  1114    PT Time Calculation (min)  40 min    Activity Tolerance  Patient tolerated treatment well    Behavior During Therapy  [facility] for tasks assessed/performed       Past Medical History:  Diagnosis Date  . Acid reflux   . Borderline diabetes   . Hypertension   . Lower extremity edema   . Thyroid disease    hyper    Past Surgical History:  Procedure Laterality Date  . BACK SURGERY    . CARPAL TUNNEL RELEASE    . CATARACT EXTRACTION    . COMPRESSION HIP SCREW Right 03/09/2016   Procedure: COMPRESSION HIP;  Surgeon: Bernita Raisin, DO;  Location: [facility] ORS;  Service: Orthopedics;  Laterality: Right;  . khyphoplasty    . TOTAL HIP ARTHROPLASTY Left 07/16/2017   Procedure: TOTAL HIP ARTHROPLASTY;  Surgeon: Donato Heinz, MD;  Location: [facility] ORS;  Service: Orthopedics;  Laterality: Left;    There were no vitals filed for this visit.  Subjective Assessment - 09/08/17 1035    Subjective  Stopped using his SPC after last session. Doing ok with it after he gets started off. 1/10 L hip pain currently     Pertinent History  S/P L THA on 07/16/2017, posterior approach. Pt states he feels pain mainly anterior lateral L thigh and across his low back area. Was told to have arthritis in his R hip. Has not noticed to low back pain in the last few weeks, not an every day occurence.  Pt also said that the surgeon said that he can sleep on his L side  and does not have to use the pillow between his knees.  Had home health PT after discharge from the hospital. Did supine hip abduction, seated knee extension, hip flexion, standing marches, squats, hip abduction.  L hip started bothering him last fall 2018 after working on his deck. Had bone on bone pain and started using his SPC. The bone on bone pain led to the surgery.       Patient Stated Goals  Walk better.     Currently in Pain?  Yes    Pain Score  1     Pain Onset  More than a month ago                               PT Education - 09/08/17 1041    Education provided  Yes    Education Details  Ther-ex    Person(s) Educated  Patient    Methods  Explanation;Demonstration;Tactile cues;Verbal cues    Comprehension  Returned demonstration;Verbalized understanding       Objectives   Therapeutic excerise  sit <> stand from elevated mat table without UE assist 5x3.  Height 24.5 inches. Hip precautions maintained.   Seated clamshells, hips less than 90 degrees flexion, resisting green band, (neutral thighs beginning and resting to maintain precautions) 10x3  Forward step up onto Air Ex pad with L LE and R LE assist 10x3  SLS on L LE with R tip toe assist 10x5 seconds for 3 sets  Side stepping 5 ft without UE assist 10x to the R and 10x to the L  Forward wedding march 32 ft, then 15 ft to promote glute muscle use  Standing L LE leg press resisting blue band with bilateral UE assist 10x3  Standing L hip extension with bilateral UE assist 10x, then 10x5 seconds for 2 sets   Standing L hip abduction with bilateral UE assist 10x5 seconds for 3 sets     Improved exercise technique, movement at target joints, use of target muscles after min to mod verbal, visual, tactile cues.   Worked on L glute med and max muscle strengthening to promote ability to perform standing tasks as well as side stepping and forward wedding march to promote balance  without AD with addution of glute strengthening. Able to ambulate throughout session without AD and no LOB. Pt tolerated session well without aggravation of symptoms.        PT Long Term Goals - 09/01/17 1244      PT LONG TERM GOAL #1   Title  Patient will be able to ambulate independently at least 500 ft to promote mobility.     Baseline  Currently ambulates with SPC, no AD at times short distances (09/01/2017)    Time  6    Period  Weeks    Status  New    Target Date  10/16/17      PT LONG TERM GOAL #2   Title  Patient will improve L LE hip and knee strength by at least 1/2 MMT grade to promote ability to ambulate, perform standing tasks.     Time  6    Period  Weeks    Status  New    Target Date  10/16/17      PT LONG TERM GOAL #3   Title  Pt will improve his hip FOTO score by at least 8 points as a demonstration of improved function.     Baseline  hip FOTO 58 (09/01/2017)    Time  6    Period  Weeks    Status  New    Target Date  10/16/17            Plan - 09/08/17 1111    Clinical Impression Statement  Worked on L glute med and max muscle strengthening to promote ability to perform standing tasks as well as side stepping and forward wedding march to promote balance without AD with addution of glute strengthening. Able to ambulate throughout session without AD and no LOB. Pt tolerated session well without aggravation of symptoms.      Rehab Potential  Good    Clinical Impairments Affecting Rehab Potential  age, weakness     PT Frequency  2x / week    PT Duration  6 weeks    PT Treatment/Interventions  Therapeutic exercise;Therapeutic activities;Balance training;Patient/family education;Neuromuscular re-education;Manual techniques;Dry needling;Aquatic Therapy;Electrical Stimulation;Iontophoresis 4mg /ml Dexamethasone    PT Next Visit Plan  L LE strengthening, gait, manual techniques, modalities PRN    Consulted and Agree with Plan of Care  Patient       Patient will  benefit from skilled therapeutic  intervention in order to improve the following deficits and impairments:  Abnormal gait, Decreased strength, Improper body mechanics, Difficulty walking  Visit Diagnosis: Muscle weakness (generalized)  Difficulty in walking, not elsewhere classified  Pain in left hip     Problem List Patient Active Problem List   Diagnosis Date Noted  . Obesity, unspecified 07/16/2017  . ILD (interstitial lung disease) (HCC) 07/16/2017  . Hypothyroidism, unspecified 07/16/2017  . Hypertension 07/16/2017  . Hyperlipidemia, unspecified 07/16/2017  . Diabetes mellitus type 2, uncomplicated (HCC) 07/16/2017  . Status post total replacement of hip 07/16/2017  . Primary osteoarthritis of left hip 06/15/2017  . Hip fracture requiring operative repair, right, closed, initial encounter [facility]) 03/08/2016    Loralyn Freshwater PT, DPT    09/08/2017, 12:39 PM  [place] [facility] [address] [place], [address] Phone: [phone]   Fax:  [phone]  Name: Lucas Melendez MRN: [id number] Date of Birth: [birth year]

## 2017-09-16 ENCOUNTER — Ambulatory Visit: Payer: Medicare Other

## 2017-09-16 DIAGNOSIS — M6281 Muscle weakness (generalized): Secondary | ICD-10-CM

## 2017-09-16 DIAGNOSIS — R262 Difficulty in walking, not elsewhere classified: Secondary | ICD-10-CM

## 2017-09-16 NOTE — Therapy (Signed)
[place] [facility] [address], [place], [address] Phone: [phone]   Fax:  [phone]  Physical Therapy Treatment  Patient Details  Name: Lucas Melendez MRN: [id number] Date of Birth: [birth year] Referring Provider: Francesco Sor, MD   Encounter Date: 09/16/2017  PT End of Session - 09/16/17 1038    Visit Number  3    Number of Visits  13    Date for PT Re-Evaluation  10/16/17    Authorization Type  3    Authorization Time Period  10    PT Start Time  1038    PT Stop Time  1120    PT Time Calculation (min)  42 min    Activity Tolerance  Patient tolerated treatment well    Behavior During Therapy  [facility] for tasks assessed/performed       Past Medical History:  Diagnosis Date  . Acid reflux   . Borderline diabetes   . Hypertension   . Lower extremity edema   . Thyroid disease    hyper    Past Surgical History:  Procedure Laterality Date  . BACK SURGERY    . CARPAL TUNNEL RELEASE    . CATARACT EXTRACTION    . COMPRESSION HIP SCREW Right 03/09/2016   Procedure: COMPRESSION HIP;  Surgeon: Bernita Raisin, DO;  Location: [facility] ORS;  Service: Orthopedics;  Laterality: Right;  . khyphoplasty    . TOTAL HIP ARTHROPLASTY Left 07/16/2017   Procedure: TOTAL HIP ARTHROPLASTY;  Surgeon: Donato Heinz, MD;  Location: [facility] ORS;  Service: Orthopedics;  Laterality: Left;    There were no vitals filed for this visit.  Subjective Assessment - 09/16/17 1039    Subjective  L hip is doing pretty good. Did a lot of yard work yesterday. L hip was not too bad.     Pertinent History  S/P L THA on 07/16/2017, posterior approach. Pt states he feels pain mainly anterior lateral L thigh and across his low back area. Was told to have arthritis in his R hip. Has not noticed to low back pain in the last few weeks, not an every day occurence.  Pt also said that the surgeon said that he can sleep on his L side and does not have to use  the pillow between his knees.  Had home health PT after discharge from the hospital. Did supine hip abduction, seated knee extension, hip flexion, standing marches, squats, hip abduction.  L hip started bothering him last fall 2018 after working on his deck. Had bone on bone pain and started using his SPC. The bone on bone pain led to the surgery.       Patient Stated Goals  Walk better.     Currently in Pain?  No/denies    Pain Score  0-No pain    Pain Onset  More than a month ago                               PT Education - 09/16/17 1042    Education provided  Yes    Education Details  ther-ex    [employer]) Educated  Patient    Methods  Explanation;Demonstration;Tactile cues;Verbal cues    Comprehension  Returned demonstration;Verbalized understanding         Objectives  Therapeutic excersise  Hip precautions maintained throughout session.   sit <> stand from elevated mat table without UE assist  10x2 Height 22 inches.    Standing L hip abduction 10x2 with 5 second holds  Standing L hip extension with yellow band around distal thighs 10x, then 10x5 second for 3 sets   Forward step up onto Air Ex pad with L LE and R LE assist 10x  Then no UE assist 10x2 with CGA  SLS on L LE with R tip toe assist 10x5 seconds for 3 sets  Side stepping 32 ft to the R and 32 ft to the L SBA  Forward wedding march 32 ft x 2 to promote glute med strengthening  Forward step up onto 4 inch step with L LE and R UE assist 10x  Then 10x without UE assist   Then forward step up onto 4 inch step then over with R UE assist 10x, then 10x2 without UE assist.   Standing L LE leg press resisting blue band with bilateral UE assist 10x3   Improved exercise technique, movement at target joints, use of target muscles after mod verbal, visual, tactile cues.   Improving ability to support himself with L LE with pt better able to perform step up exercises both on  firm and uneven surfaces (Air Ex pad and step) without UE assist. No complain of L hip pain throughout session. Continued working on glute med and max strengthening to promote ability to perform standing tasks.        PT Long Term Goals - 09/01/17 1244      PT LONG TERM GOAL #1   Title  Patient will be able to ambulate independently at least 500 ft to promote mobility.     Baseline  Currently ambulates with SPC, no AD at times short distances (09/01/2017)    Time  6    Period  Weeks    Status  New    Target Date  10/16/17      PT LONG TERM GOAL #2   Title  Patient will improve L LE hip and knee strength by at least 1/2 MMT grade to promote ability to ambulate, perform standing tasks.     Time  6    Period  Weeks    Status  New    Target Date  10/16/17      PT LONG TERM GOAL #3   Title  Pt will improve his hip FOTO score by at least 8 points as a demonstration of improved function.     Baseline  hip FOTO 58 (09/01/2017)    Time  6    Period  Weeks    Status  New    Target Date  10/16/17            Plan - 09/16/17 1113    Clinical Impression Statement  Improving ability to support himself with L LE with pt better able to perform step up exercises both on firm and uneven surfaces (Air Ex pad and step) without UE assist. No complain of L hip pain throughout session. Continued working on glute med and max strengthening to promote ability to perform standing tasks.     Rehab Potential  Good    Clinical Impairments Affecting Rehab Potential  age, weakness     PT Frequency  2x / week    PT Duration  6 weeks    PT Treatment/Interventions  Therapeutic exercise;Therapeutic activities;Balance training;Patient/family education;Neuromuscular re-education;Manual techniques;Dry needling;Aquatic Therapy;Electrical Stimulation;Iontophoresis 4mg /ml Dexamethasone    PT Next Visit Plan  L LE strengthening, gait, manual techniques, modalities PRN  Consulted and Agree with Plan of Care   Patient       Patient will benefit from skilled therapeutic intervention in order to improve the following deficits and impairments:  Abnormal gait, Decreased strength, Improper body mechanics, Difficulty walking  Visit Diagnosis: Muscle weakness (generalized)  Difficulty in walking, not elsewhere classified     Problem List Patient Active Problem List   Diagnosis Date Noted  . Obesity, unspecified 07/16/2017  . ILD (interstitial lung disease) (HCC) 07/16/2017  . Hypothyroidism, unspecified 07/16/2017  . Hypertension 07/16/2017  . Hyperlipidemia, unspecified 07/16/2017  . Diabetes mellitus type 2, uncomplicated (HCC) 07/16/2017  . Status post total replacement of hip 07/16/2017  . Primary osteoarthritis of left hip 06/15/2017  . Hip fracture requiring operative repair, right, closed, initial encounter [facility](HCC) 03/08/2016    Loralyn FreshwaterMiguel Milyn Stapleton PT, DPT   09/16/2017, 11:30 AM  [place] [facility] [address] [place], [place], [address] Phone: [phone]   Fax:  [phone]  Name: Lucas Melendez MRN: [id number] Date of Birth: 1930-08-24

## 2017-09-18 ENCOUNTER — Ambulatory Visit: Payer: Medicare Other

## 2017-09-18 DIAGNOSIS — M6281 Muscle weakness (generalized): Secondary | ICD-10-CM | POA: Diagnosis not present

## 2017-09-18 DIAGNOSIS — R262 Difficulty in walking, not elsewhere classified: Secondary | ICD-10-CM

## 2017-09-18 NOTE — Therapy (Signed)
[place] [facility] [address] [place], [address] Phone: [phone]   Fax:  [phone]  Physical Therapy Treatment  Patient Details  Name: Lucas Melendez MRN: [id number] Date of Birth: 05-Jul-1930 Referring Provider: Francesco Sor, MD   Encounter Date: 09/18/2017  PT End of Session - 09/18/17 1032    Visit Number  4    Number of Visits  13    Date for PT Re-Evaluation  10/16/17    Authorization Type  4    Authorization Time Period  10    PT Start Time  1033    PT Stop Time  1115    PT Time Calculation (min)  42 min    Activity Tolerance  Patient tolerated treatment well    Behavior During Therapy  [facility] for tasks assessed/performed       Past Medical History:  Diagnosis Date  . Acid reflux   . Borderline diabetes   . Hypertension   . Lower extremity edema   . Thyroid disease    hyper    Past Surgical History:  Procedure Laterality Date  . BACK SURGERY    . CARPAL TUNNEL RELEASE    . CATARACT EXTRACTION    . COMPRESSION HIP SCREW Right 03/09/2016   Procedure: COMPRESSION HIP;  Surgeon: Bernita Raisin, DO;  Location: [facility] ORS;  Service: Orthopedics;  Laterality: Right;  . khyphoplasty    . TOTAL HIP ARTHROPLASTY Left 07/16/2017   Procedure: TOTAL HIP ARTHROPLASTY;  Surgeon: Donato Heinz, MD;  Location: [facility] ORS;  Service: Orthopedics;  Laterality: Left;    There were no vitals filed for this visit.  Subjective Assessment - 09/18/17 1034    Subjective  Low back sore from gardening yesterday (mulching tomatoes). No L hip pain, just soreness.     Pertinent History  S/P L THA on 07/16/2017, posterior approach. Pt states he feels pain mainly anterior lateral L thigh and across his low back area. Was told to have arthritis in his R hip. Has not noticed to low back pain in the last few weeks, not an every day occurence.  Pt also said that the surgeon said that he can sleep on his L side and does not have to  use the pillow between his knees.  Had home health PT after discharge from the hospital. Did supine hip abduction, seated knee extension, hip flexion, standing marches, squats, hip abduction.  L hip started bothering him last fall 2018 after working on his deck. Had bone on bone pain and started using his SPC. The bone on bone pain led to the surgery.       Patient Stated Goals  Walk better.     Currently in Pain?  No/denies    Pain Score  0-No pain    Pain Onset  More than a month ago                               PT Education - 09/18/17 1038    Education provided  Yes    Education Details  ther-ex    [employer]) Educated  Patient    Methods  Explanation;Demonstration;Tactile cues;Verbal cues    Comprehension  Returned demonstration;Verbalized understanding         Objectives 9 weeks post op  Therapeutic excersise  Hip precautions maintained throughout session.   sit <> stand from elevated mat table without UE assist 10x2  Height 22 inches.   Forward step up onto 4 inch step with L LE and R UE assist 10x             Then 10x without UE assist              Then forward step up onto 4 inch step then over with R UE assist to no UE assist 10x2    Standing L hip extension with yellow band around distal thighs 10x3, then 10x5 second for 2 sets   Standing L hip abduction 10x2 with 5 second holds resisting yellow band  Forward step up onto regular step with L LE with bilateral UE assist 10x3   Side stepping 32 ft to the R and 32 ft to the L SBA  Forward wedding march 32 ft x 2 to promote glute med strengthening SBA   SLS on L LE with bilateral UE assist 10x5 seconds for 3 sets   Standing L LE leg press resisting blue band with bilateral UE assist10x3   Forward step up onto Air Ex pad with L LE and one UE assist 10x             Then no UE assist 10x with SBA   Improved exercise technique, movement at target joints, use of  target muscles after min to mod verbal, visual, tactile cues.    Improving functional LE strength observed. Able to perform sit <> stand transfers from mat table at 22 inches height (hip precautinos maintained) without UE assist as well as stepping onto and over a 4 inch step without UE assist at times. Continued working on LE strengthening to promote ability to perform standing tasks. Pt tolerated session well without aggravation of symptoms.          PT Long Term Goals - 09/01/17 1244      PT LONG TERM GOAL #1   Title  Patient will be able to ambulate independently at least 500 ft to promote mobility.     Baseline  Currently ambulates with SPC, no AD at times short distances (09/01/2017)    Time  6    Period  Weeks    Status  New    Target Date  10/16/17      PT LONG TERM GOAL #2   Title  Patient will improve L LE hip and knee strength by at least 1/2 MMT grade to promote ability to ambulate, perform standing tasks.     Time  6    Period  Weeks    Status  New    Target Date  10/16/17      PT LONG TERM GOAL #3   Title  Pt will improve his hip FOTO score by at least 8 points as a demonstration of improved function.     Baseline  hip FOTO 58 (09/01/2017)    Time  6    Period  Weeks    Status  New    Target Date  10/16/17            Plan - 09/18/17 1038    Clinical Impression Statement  Improving functional LE strength observed. Able to perform sit <> stand transfers from mat table at 22 inches height (hip precautinos maintained) without UE assist as well as stepping onto and over a 4 inch step without UE assist at times. Continued working on LE strengthening to promote ability to perform standing tasks. Pt tolerated session well without aggravation of symptoms.  Rehab Potential  Good    Clinical Impairments Affecting Rehab Potential  age, weakness     PT Frequency  2x / week    PT Duration  6 weeks    PT Treatment/Interventions  Therapeutic exercise;Therapeutic  activities;Balance training;Patient/family education;Neuromuscular re-education;Manual techniques;Dry needling;Aquatic Therapy;Electrical Stimulation;Iontophoresis 4mg /ml Dexamethasone    PT Next Visit Plan  L LE strengthening, gait, manual techniques, modalities PRN    Consulted and Agree with Plan of Care  Patient       Patient will benefit from skilled therapeutic intervention in order to improve the following deficits and impairments:  Abnormal gait, Decreased strength, Improper body mechanics, Difficulty walking  Visit Diagnosis: Muscle weakness (generalized)  Difficulty in walking, not elsewhere classified     Problem List Patient Active Problem List   Diagnosis Date Noted  . Obesity, unspecified 07/16/2017  . ILD (interstitial lung disease) (HCC) 07/16/2017  . Hypothyroidism, unspecified 07/16/2017  . Hypertension 07/16/2017  . Hyperlipidemia, unspecified 07/16/2017  . Diabetes mellitus type 2, uncomplicated (HCC) 07/16/2017  . Status post total replacement of hip 07/16/2017  . Primary osteoarthritis of left hip 06/15/2017  . Hip fracture requiring operative repair, right, closed, initial encounter [facility]) 03/08/2016   Loralyn Freshwater PT, DPT   09/18/2017, 12:35 PM  [place] [facility] [address] [place], [address] Phone: [phone]   Fax:  [phone]  Name: Lucas Melendez MRN: [id number] Date of Birth: 12/02/1930

## 2017-09-22 ENCOUNTER — Ambulatory Visit: Payer: Medicare Other | Attending: Orthopedic Surgery

## 2017-09-22 DIAGNOSIS — M6281 Muscle weakness (generalized): Secondary | ICD-10-CM | POA: Diagnosis present

## 2017-09-22 DIAGNOSIS — M25552 Pain in left hip: Secondary | ICD-10-CM | POA: Diagnosis present

## 2017-09-22 DIAGNOSIS — R262 Difficulty in walking, not elsewhere classified: Secondary | ICD-10-CM | POA: Insufficient documentation

## 2017-09-22 NOTE — Therapy (Signed)
[place] [facility] [address] [place], [place], [address] Phone: [phone]   Fax:  [phone]  Physical Therapy Treatment And Discharge Summary   Patient Details  Name: Lucas Melendez MRN: [id number] Date of Birth: 1930/12/15 Referring Provider: Francesco SorJames Hooten, MD   Encounter Date: 09/22/2017  PT End of Session - 09/22/17 0953    Visit Number  5    Number of Visits  13    Date for PT Re-Evaluation  10/16/17    Authorization Type  5    Authorization Time Period  10    PT Start Time  [phone]    PT Stop Time  1037    PT Time Calculation (min)  44 min    Activity Tolerance  Patient tolerated treatment well    Behavior During Therapy  [facility] for [facility] assessed/performed       Past Medical History:  Diagnosis Date  . Acid reflux   . Borderline diabetes   . Hypertension   . Lower extremity edema   . Thyroid disease    hyper    Past Surgical History:  Procedure Laterality Date  . BACK SURGERY    . CARPAL TUNNEL RELEASE    . CATARACT EXTRACTION    . COMPRESSION HIP SCREW Right 03/09/2016   Procedure: COMPRESSION HIP;  Surgeon: Bernita RaisinKeivan Abtahi, DO;  Location: [facility] ORS;  Service: Orthopedics;  Laterality: Right;  . khyphoplasty    . TOTAL HIP ARTHROPLASTY Left 07/16/2017   Procedure: TOTAL HIP ARTHROPLASTY;  Surgeon: Donato HeinzHooten, James P, MD;  Location: [facility] ORS;  Service: Orthopedics;  Laterality: Left;    There were no vitals filed for this visit.  Subjective Assessment - 09/22/17 0955    Subjective  L hip alright. Able to use his push mower about an hour. His L knee bothers him. 0/10 L hip pain at most for the past 7 days. Does not think he needs more PT for his L hip after today. Walking is good.  Able to walk over grass without AD no problems.     Pertinent History  S/P L THA on 07/16/2017, posterior approach. Pt states he feels pain mainly anterior lateral L thigh and across his low back area. Was told to have arthritis  in his R hip. Has not noticed to low back pain in the last few weeks, not an every day occurence.  Pt also said that the surgeon said that he can sleep on his L side and does not have to use the pillow between his knees.  Had home health PT after discharge from the hospital. Did supine hip abduction, seated knee extension, hip flexion, standing marches, squats, hip abduction.  L hip started bothering him last fall 2018 after working on his deck. Had bone on bone pain and started using his SPC. The bone on bone pain led to the surgery.       Patient Stated Goals  Walk better.     Currently in Pain?  No/denies    Pain Score  0-No pain    Pain Onset  More than a month ago         [facility] - 09/22/17 0958      Strength   Left Hip Flexion  5/5    Left Hip Extension  4+/5 Seated manually resisted leg press    Left Hip ABduction  5/5 seated manually resisted clamshell isometric    Left Knee Flexion  4+/5  Left Knee Extension  5/5                           PT Education - 09/22/17 1018    Education provided  Yes    Education Details  ther-ex    [employer]) Educated  Patient    Methods  Explanation;Demonstration;Tactile cues;Verbal cues    Comprehension  Returned demonstration;Verbalized understanding           Objectives   Therapeutic excersise  Hip precautions maintained throughout session.  sit <> stand from elevated mat table without UE assist10x2 Height 22inches.   Seated manually resisted L hip flexion, clamshell isometrics, L leg press, L knee flexion, L knee extension 1x each way  Reviewed progress with L LE strength with pt.   Forward step up onto 4 inch step with L LE and no UE assist 10x  Then forward step up onto 4 inch step then over without UE assist 10x2   Ascending and descending 4 regular steps with bilateral UE assist 3x in a reciprocal pattern  Forward step up onto regular step with L LE  with bilateral UE assist 10x, then with R UE assist 10x2  Reviewed and given as part of his HEP (10x3 daily). Pt demonstrated and verbalized understanding  Standing L hip abduction 10x, then 10x2 with 5 second holds resisting yellow band   Standing L hip extension with yellow band around distal thighs 10x5 second for 3 sets  SLS on L LE with bilateral UE assist 10x5 seconds for 3 sets  Forward stepping over 8 inch cones with L LE 10x  Then 10x with R LE   No UE assist for both ways  No LOB, able to clear feet.    Improved exercise technique, movement at target joints, use of target muscles after min to  mod verbal, visual, tactile cues.      Pt has demonstrated improved L LE strength, ability to ambulate, with pt being able to walk without use of AD and improved function since initial evaluation. Pt has made very good progress with physical therapy towards goals. Skilled physical therapy services discharged with pt continuing progress with his HEP.      PT Long Term Goals - 09/22/17 1057      PT LONG TERM GOAL #1   Title  Patient will be able to ambulate independently at least 500 ft to promote mobility.     Baseline  Currently ambulates with SPC, no AD at times short distances (09/01/2017); Independent ambulation (09/22/2017)    Time  6    Period  Weeks    Status  Achieved    Target Date  10/16/17      PT LONG TERM GOAL #2   Title  Patient will improve L LE hip and knee strength by at least 1/2 MMT grade to promote ability to ambulate, perform standing tasks.     Time  6    Period  Weeks    Status  Achieved    Target Date  10/16/17      PT LONG TERM GOAL #3   Title  Pt will improve his hip FOTO score by at least 8 points as a demonstration of improved function.     Baseline  hip FOTO 58 (09/01/2017); 76 (09/22/2017)    Time  6    Period  Weeks    Status  Achieved    Target Date  10/16/17  Plan - 09/22/17 1030    Clinical Impression Statement   Pt has demonstrated improved L LE strength, ability to ambulate, with pt being able to walk without use of AD and improved function since initial evaluation. Pt has made very good progress with physical therapy towards goals. Skilled physical therapy services discharged with pt continuing progress with his HEP.     History and Personal Factors relevant to plan of care:  Age, history of multiple surgeries    Clinical Presentation  Stable    Clinical Presentation due to:  Pt has made very good progress towards goals.     Clinical Decision Making  Low    Rehab Potential  Good    Clinical Impairments Affecting Rehab Potential  age, weakness     PT Frequency  --    PT Duration  --    PT Treatment/Interventions  Therapeutic exercise;Therapeutic activities;Balance training;Patient/family education;Neuromuscular re-education    PT Next Visit Plan  Continue progress with exercises at home.    Consulted and Agree with Plan of Care  Patient       Patient will benefit from skilled therapeutic intervention in order to improve the following deficits and impairments:  Abnormal gait, Decreased strength, Improper body mechanics, Difficulty walking  Visit Diagnosis: Muscle weakness (generalized)  Difficulty in walking, not elsewhere classified  Pain in left hip     Problem List Patient Active Problem List   Diagnosis Date Noted  . Obesity, unspecified 07/16/2017  . ILD (interstitial lung disease) (HCC) 07/16/2017  . Hypothyroidism, unspecified 07/16/2017  . Hypertension 07/16/2017  . Hyperlipidemia, unspecified 07/16/2017  . Diabetes mellitus type 2, uncomplicated (HCC) 07/16/2017  . Status post total replacement of hip 07/16/2017  . Primary osteoarthritis of left hip 06/15/2017  . Hip fracture requiring operative repair, right, closed, initial encounter [facility]) 03/08/2016    Thank you for your referral.  Loralyn Freshwater PT, DPT   09/22/2017, 11:04 AM  [place] [facility] [address], [place], [address] Phone: [phone]   Fax:  [phone]  Name: Lucas Melendez MRN: [id number] Date of Birth: 11-13-1930

## 2017-09-22 NOTE — Patient Instructions (Signed)
Forward step up onto regular step with L LE with bilateral UE assist 10x3. Reviewed and given as part of his HEP.  Pt demonstrated and verbalized understanding.

## 2017-09-24 ENCOUNTER — Ambulatory Visit: Payer: Medicare Other

## 2017-09-29 ENCOUNTER — Ambulatory Visit: Payer: Medicare Other

## 2017-10-01 ENCOUNTER — Ambulatory Visit: Payer: Medicare Other

## 2017-10-03 DIAGNOSIS — E119 Type 2 diabetes mellitus without complications: Secondary | ICD-10-CM | POA: Diagnosis not present

## 2017-10-03 DIAGNOSIS — E039 Hypothyroidism, unspecified: Secondary | ICD-10-CM | POA: Diagnosis not present

## 2017-10-03 DIAGNOSIS — E7800 Pure hypercholesterolemia, unspecified: Secondary | ICD-10-CM | POA: Diagnosis not present

## 2017-10-10 DIAGNOSIS — Z0000 Encounter for general adult medical examination without abnormal findings: Secondary | ICD-10-CM | POA: Diagnosis not present

## 2017-10-10 DIAGNOSIS — E119 Type 2 diabetes mellitus without complications: Secondary | ICD-10-CM | POA: Diagnosis not present

## 2017-10-10 DIAGNOSIS — I10 Essential (primary) hypertension: Secondary | ICD-10-CM | POA: Diagnosis not present

## 2017-10-10 DIAGNOSIS — J849 Interstitial pulmonary disease, unspecified: Secondary | ICD-10-CM | POA: Diagnosis not present

## 2017-11-20 DIAGNOSIS — Z08 Encounter for follow-up examination after completed treatment for malignant neoplasm: Secondary | ICD-10-CM | POA: Diagnosis not present

## 2017-11-20 DIAGNOSIS — L570 Actinic keratosis: Secondary | ICD-10-CM | POA: Diagnosis not present

## 2017-11-20 DIAGNOSIS — Z85828 Personal history of other malignant neoplasm of skin: Secondary | ICD-10-CM | POA: Diagnosis not present

## 2017-11-20 DIAGNOSIS — L821 Other seborrheic keratosis: Secondary | ICD-10-CM | POA: Diagnosis not present

## 2017-12-19 IMAGING — RF DG ESOPHAGUS
10 of 14 series · 14 of 20 positions shown · non-contrast
Comparison: None in PACs

CLINICAL DATA: Excessive phlegm in the morning. Symptoms of reflux
after breakfast.

EXAM:
ESOPHOGRAM / BARIUM SWALLOW / BARIUM TABLET STUDY
TECHNIQUE: Combined double contrast and single contrast examination performed
using effervescent crystals, thick barium liquid, and thin barium
liquid. The patient was observed with fluoroscopy swallowing a 13 mm
barium sulphate tablet.
FLUOROSCOPY TIME:  Fluoroscopy Time:  1 minutes, 36 seconds
Radiation Exposure Index (if provided by the fluoroscopic device):
04/25/2001 micro Gy per meters square
Number of Acquired Spot Images: 14+ 2 video loops.

[Series 1: fluoro_barium 2fps_bw · 0.17mm/px · 1 of 1 slices shown (1 of 10)]
[im 1/1]
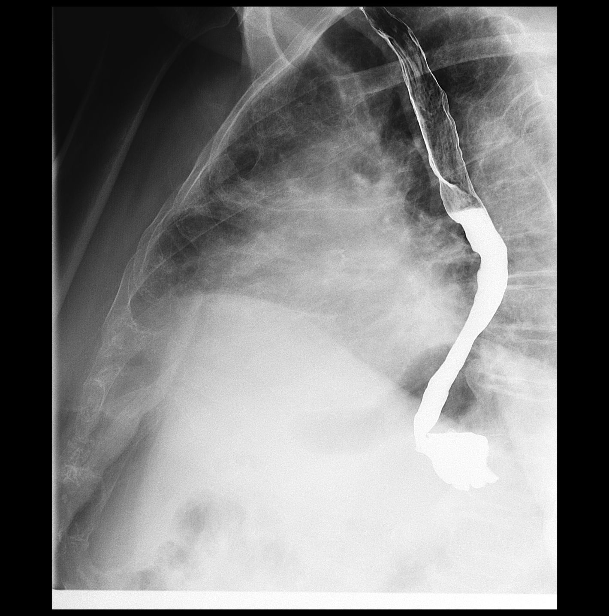

[Series 3: fluoro_barium 2fps_bw · 0.17mm/px · 1 of 1 slices shown (2 of 10)]
[im 1/1]
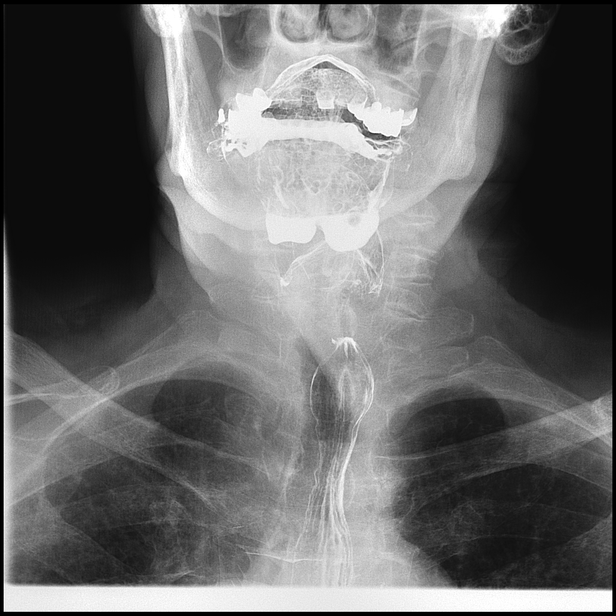

[Series 4: fluoro_barium 2fps_bw · 0.17mm/px · 1 of 1 slices shown (3 of 10)]
[im 1/1]
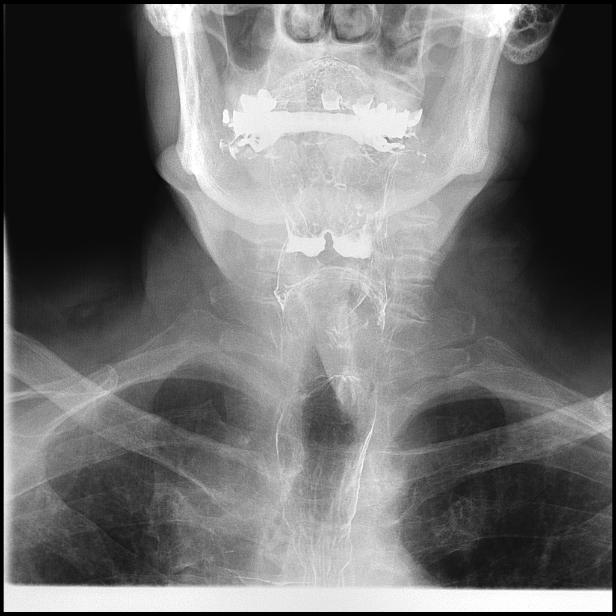

[Series 5: fluoro_barium 2fps_bw · 0.18mm/px · 3 of 9 frames shown (4 of 10)]
[frame 2/9]
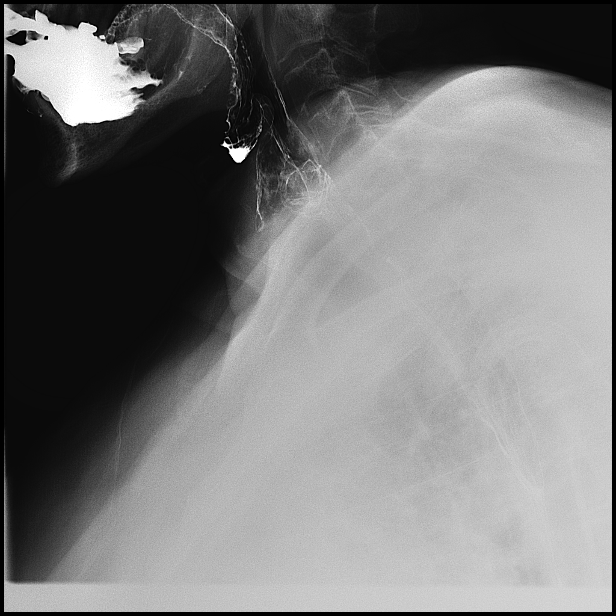
[frame 5/9]
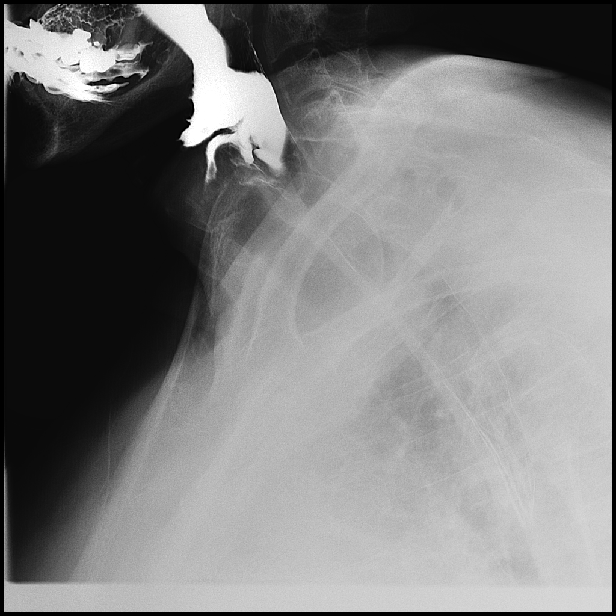
[frame 8/9]
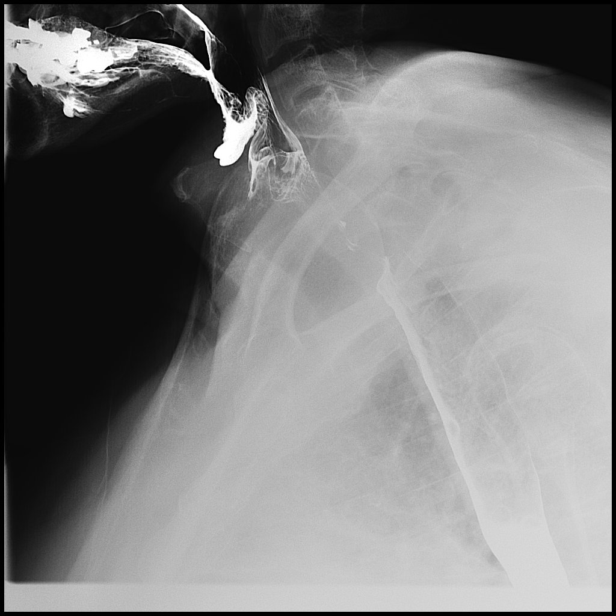

[Series 7: fluoro_barium 2fps_bw · 0.18mm/px · 1 of 1 slices shown (5 of 10)]
[im 1/1]
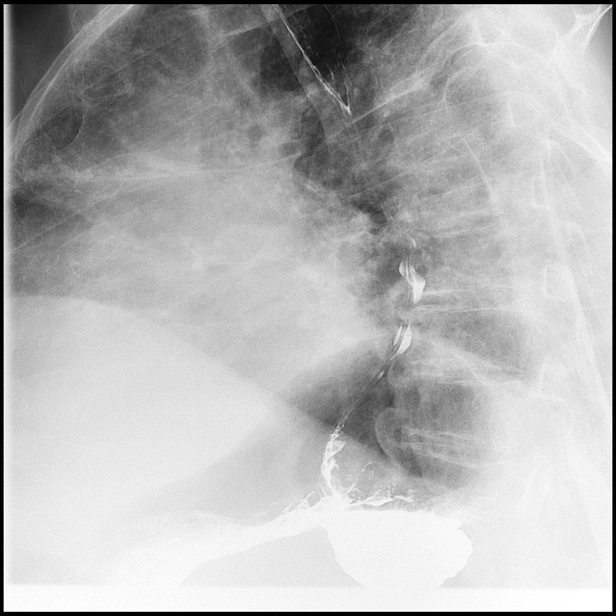

[Series 8: fluoro_barium 2fps_bw · 0.18mm/px · 1 of 1 slices shown (6 of 10)]
[im 1/1]
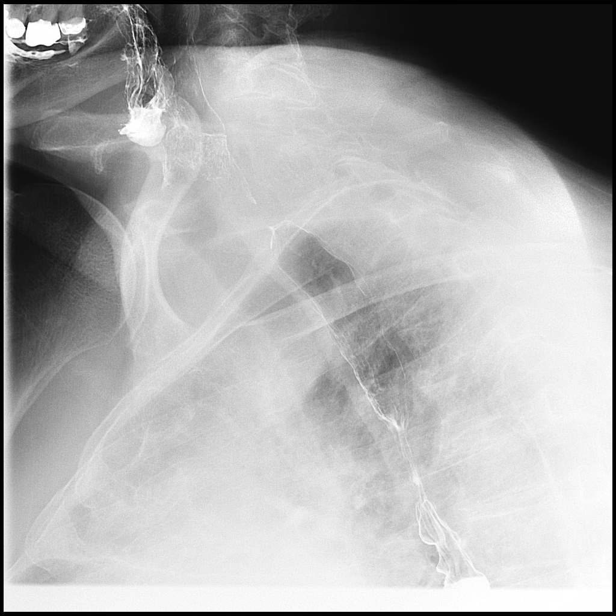

[Series 10: fluoro_barium 2fps_bw · 0.18mm/px · 1 of 1 slices shown (7 of 10)]
[im 1/1]
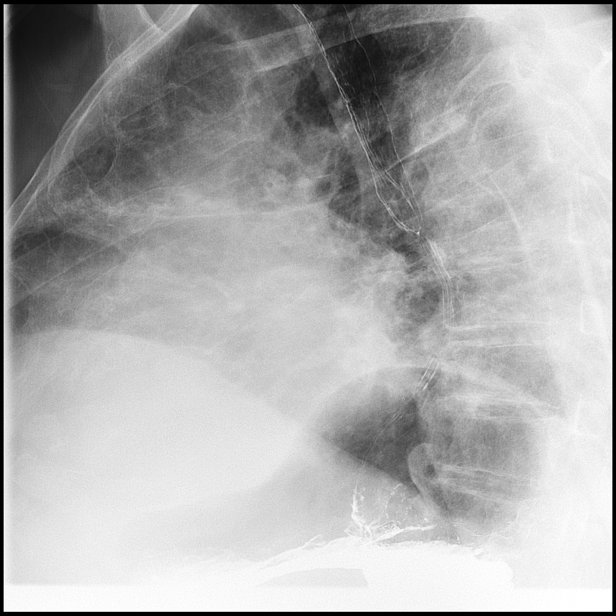

[Series 11: fluoro_barium 2fps_bw · 0.18mm/px · 1 of 1 slices shown (8 of 10)]
[im 1/1]
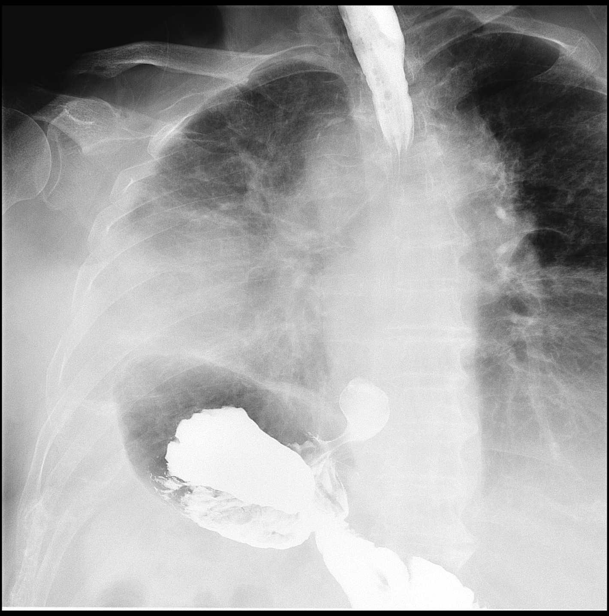

[Series 12: fluoro_barium 2fps_bw · 0.18mm/px · 3 of 20 frames shown (9 of 10)]
[frame 4/20]
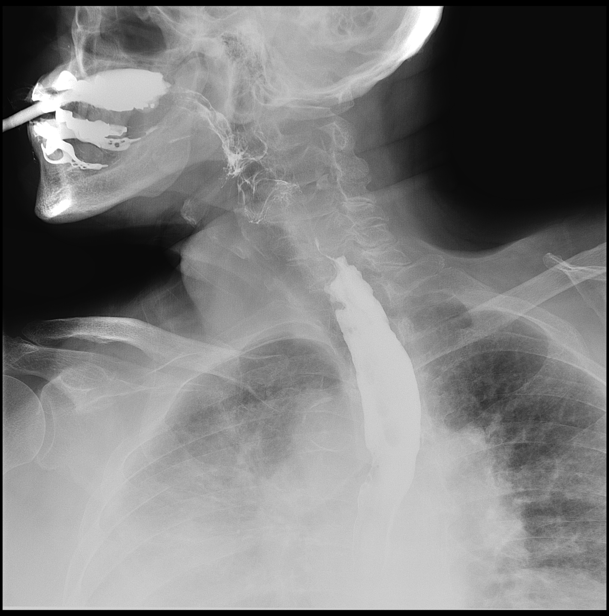
[frame 11/20]
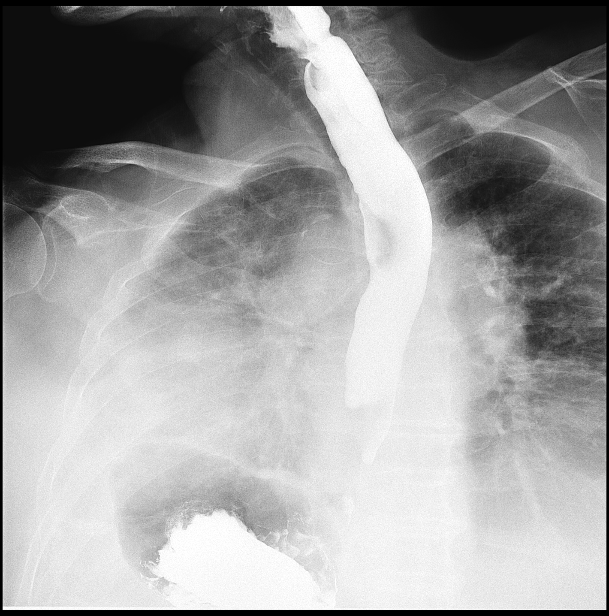
[frame 18/20]
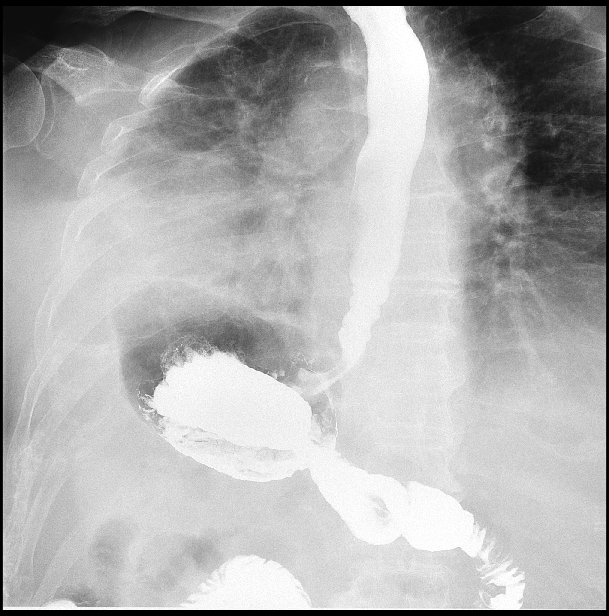

[Series 14: fluoro_barium 2fps_bw · 0.17mm/px · 1 of 1 slices shown (10 of 10)]
[im 1/1]
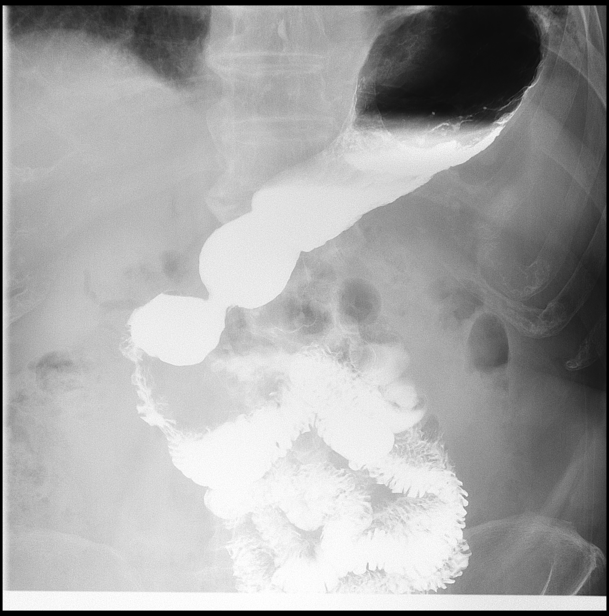

[14 of 20 positions shown; findings below may reference images not displayed]

FINDINGS: The patient ingested thick and thin barium and the gas-forming
crystals without difficulty. The hypopharynx distended well. There
was no laryngeal penetration of the barium. A prominent
cricopharyngeus muscle impression was observed transiently in the
cervical esophagus.

The cervical and thoracic esophagus descending distended well. There
were prominent tertiary contractions observed intermittently. In the
prone position esophageal motility was fairly polar. A small
reducible hiatal hernia was observed transiently. The 12 mm barium
tablet passed promptly from the mouth to the stomach. No significant
reflux was observed.
IMPRESSION: No laryngeal penetration of the barium.

Mild changes of presbyesophagus. Prominent cricopharyngeus muscle
impression consistent with chronic reflux. No evidence of stricture,
ulceration, or mass.

## 2018-01-10 DIAGNOSIS — Z23 Encounter for immunization: Secondary | ICD-10-CM | POA: Diagnosis not present

## 2018-04-09 DIAGNOSIS — E7800 Pure hypercholesterolemia, unspecified: Secondary | ICD-10-CM | POA: Diagnosis not present

## 2018-04-09 DIAGNOSIS — E119 Type 2 diabetes mellitus without complications: Secondary | ICD-10-CM | POA: Diagnosis not present

## 2018-04-09 DIAGNOSIS — E039 Hypothyroidism, unspecified: Secondary | ICD-10-CM | POA: Diagnosis not present

## 2018-04-09 DIAGNOSIS — I10 Essential (primary) hypertension: Secondary | ICD-10-CM | POA: Diagnosis not present

## 2018-04-16 DIAGNOSIS — J849 Interstitial pulmonary disease, unspecified: Secondary | ICD-10-CM | POA: Diagnosis not present

## 2018-04-16 DIAGNOSIS — Z0001 Encounter for general adult medical examination with abnormal findings: Secondary | ICD-10-CM | POA: Diagnosis not present

## 2018-04-16 DIAGNOSIS — E119 Type 2 diabetes mellitus without complications: Secondary | ICD-10-CM | POA: Diagnosis not present

## 2018-04-16 DIAGNOSIS — I10 Essential (primary) hypertension: Secondary | ICD-10-CM | POA: Diagnosis not present

## 2018-07-16 DIAGNOSIS — M1611 Unilateral primary osteoarthritis, right hip: Secondary | ICD-10-CM | POA: Diagnosis not present

## 2018-07-16 DIAGNOSIS — Z96642 Presence of left artificial hip joint: Secondary | ICD-10-CM | POA: Diagnosis not present

## 2018-08-19 DIAGNOSIS — E139 Other specified diabetes mellitus without complications: Secondary | ICD-10-CM | POA: Diagnosis not present

## 2018-08-19 DIAGNOSIS — H35373 Puckering of macula, bilateral: Secondary | ICD-10-CM | POA: Diagnosis not present

## 2018-08-25 DIAGNOSIS — H3322 Serous retinal detachment, left eye: Secondary | ICD-10-CM | POA: Diagnosis not present

## 2018-09-22 DIAGNOSIS — H35372 Puckering of macula, left eye: Secondary | ICD-10-CM | POA: Diagnosis not present

## 2018-10-08 DIAGNOSIS — E039 Hypothyroidism, unspecified: Secondary | ICD-10-CM | POA: Diagnosis not present

## 2018-10-08 DIAGNOSIS — E119 Type 2 diabetes mellitus without complications: Secondary | ICD-10-CM | POA: Diagnosis not present

## 2018-10-15 DIAGNOSIS — I10 Essential (primary) hypertension: Secondary | ICD-10-CM | POA: Diagnosis not present

## 2018-10-15 DIAGNOSIS — J849 Interstitial pulmonary disease, unspecified: Secondary | ICD-10-CM | POA: Diagnosis not present

## 2018-10-15 DIAGNOSIS — E119 Type 2 diabetes mellitus without complications: Secondary | ICD-10-CM | POA: Diagnosis not present

## 2018-10-15 DIAGNOSIS — Z0000 Encounter for general adult medical examination without abnormal findings: Secondary | ICD-10-CM | POA: Diagnosis not present

## 2018-10-27 DIAGNOSIS — M25521 Pain in right elbow: Secondary | ICD-10-CM | POA: Diagnosis not present

## 2018-10-27 DIAGNOSIS — M7021 Olecranon bursitis, right elbow: Secondary | ICD-10-CM | POA: Diagnosis not present

## 2019-01-21 ENCOUNTER — Other Ambulatory Visit: Payer: Self-pay

## 2019-01-21 DIAGNOSIS — Z20822 Contact with and (suspected) exposure to covid-19: Secondary | ICD-10-CM

## 2019-01-21 NOTE — Progress Notes (Unsigned)
l °

## 2019-01-22 LAB — NOVEL CORONAVIRUS, NAA: SARS-CoV-2, NAA: DETECTED — AB

## 2019-02-23 DEATH — deceased
